# Patient Record
Sex: Female | Born: 1990
Health system: Southern US, Community
[De-identification: ages and names within clinical notes are randomized; demographics above are authoritative.]

---

## 2017-05-17 ENCOUNTER — Ambulatory Visit (INDEPENDENT_AMBULATORY_CARE_PROVIDER_SITE_OTHER): Payer: 59 | Admitting: Family Medicine

## 2017-05-17 ENCOUNTER — Encounter: Payer: Self-pay | Admitting: Family Medicine

## 2017-05-17 VITALS — BP 126/82 | HR 78 | Temp 98.3°F | Resp 16 | Wt 164.2 lb

## 2017-05-17 DIAGNOSIS — H1031 Unspecified acute conjunctivitis, right eye: Secondary | ICD-10-CM

## 2017-05-17 DIAGNOSIS — H01001 Unspecified blepharitis right upper eyelid: Secondary | ICD-10-CM

## 2017-05-17 MED ORDER — ERYTHROMYCIN 5 MG/GM OP OINT: 1.0000 "application " | TOPICAL_OINTMENT | Freq: Three times a day (TID) | OPHTHALMIC | 0 refills | Status: DC

## 2017-05-17 NOTE — Progress Notes (Signed)
Subjective:  This chart was scribed for Shade Flood, MD by Veverly Fells, at Primary Care at [facility].  This patient was seen in room 2 and the patient's care was started at 11:20 AM.   Chief Complaint  Patient presents with  . Eye Pain    right eye x1week      Patient ID: Christy Woodward, female    DOB: May 21, 1991, 26 y.o.   MRN: [id number]  HPI HPI Comments: Christy Woodward is a 26 y.o. female who presents to Primary Care at [facility] with right eye pain onset about one week ago with associated symptoms of a small pimple like bump and redness.  She has had associated symptoms of crusting for the past week. Patient states that her eye has been getting dry more frequently since her symptoms started so she has been using eye drops daily.  She had to use a compress when this occurred because it was so dry. Patient has been picking at the pimple like bump in her eye as it was bothering her.  She is currently getting over a cold- started less than a week ago (sore throat- now resolved, headache and congestion).  She denies a fever. She took Dayquil and Nyquil for relief during her cold like symptoms.  She does not wear contacts or glasses.   Patient is a Designer, jewellery.    There are no active problems to display for this patient.  No past medical history on file. No past surgical history on file. No Known Allergies Prior to Admission medications   Not on File   Social History   Social History  . Marital status: Single    Spouse name: N/A  . Number of children: N/A  . Years of education: N/A   Occupational History  . Not on file.   Social History Main Topics  . Smoking status: Never Smoker  . Smokeless tobacco: Never Used  . Alcohol use No  . Drug use: Unknown  . Sexual activity: Not on file   Other Topics Concern  . Not on file   Social History Narrative  . No narrative on file      Review of Systems  Constitutional: Negative for chills and fever.  Eyes:  Positive for pain, discharge and redness.  Respiratory: Negative for cough, choking and shortness of breath.   Gastrointestinal: Negative for nausea and vomiting.  Musculoskeletal: Negative for neck pain and neck stiffness.  Neurological: Negative for seizures and speech difficulty.       Objective:   Physical Exam  Constitutional: She is oriented to person, place, and time. She appears well-developed and well-nourished. No distress.  HENT:  Head: Normocephalic and atraumatic.  Right Ear: Hearing, tympanic membrane, external ear and ear canal normal.  Left Ear: Hearing, tympanic membrane, external ear and ear canal normal.  Nose: Nose normal.  Mouth/Throat: Oropharynx is clear and moist. No oropharyngeal exudate.  Eyes: Conjunctivae and EOM are normal. Pupils are equal, round, and reactive to light.  Some injection throughout the right sclera diffusely with some crusting on the right upper lid- lateral aspect. very minimal soft tissue swelling of the lateral lid in the area of exudate. Anterior chamber is clear. No pre auricular lymph nodes.  No exudate at the canthi.   Cardiovascular: Normal rate, regular rhythm, normal heart sounds and intact distal pulses.   No murmur heard. Pulmonary/Chest: Effort normal and breath sounds normal. No respiratory distress. She has no wheezes. She has no  rhonchi.  Neurological: She is alert and oriented to person, place, and time.  Skin: Skin is warm and dry. No rash noted.  Psychiatric: She has a normal mood and affect. Her behavior is normal.  Vitals reviewed.   Vitals:   05/17/17 1041  BP: 126/82  Pulse: 78  Resp: 16  Temp: 98.3 F (36.8 C)  TempSrc: Oral  SpO2: 100%  Weight: 164 lb 3.2 oz (74.5 kg)    Visual Acuity Screening   Right eye Left eye Both eyes  Without correction: 20/15 20/15 20/13-1  With correction:            Assessment & Plan:    Christy DyRaven I Woodward is a 26 y.o. female Blepharitis of right upper eyelid, unspecified  type - Plan: erythromycin (ROMYCIN) ophthalmic ointment  Acute conjunctivitis of right eye, unspecified acute conjunctivitis type - Plan: erythromycin (ROMYCIN) ophthalmic ointment  Small area of inflammation/crusting right upper eyelid, now with secondary conjunctivitis. Other viral symptoms recently that are improving, possible viral conjunctivitis but with the affected area of eyelid will cover with antibiotic ointment.  - Start erythromycin ointment 3 times a day, daily at bedtime, RTC precautions if not improving for the next few days. Handout given on blepharitis treatment as well.  Meds ordered this encounter  Medications  . erythromycin [facility](ROMYCIN) ophthalmic ointment    Sig: Place 1 application into the right eye 3 (three) times daily. And bedtime. 1/2 inch ribbon each time.    Dispense:  3.5 g    Refill:  0   Patient Instructions    You may have a mild course of blepharitis on the right upper eyelid, but conjunctiva does appear to have some component of infection at this time. See information below on both conditions. Start erythromycin ointment 3 times per day, and at bedtime. If redness is not improving within the next few days, worsening vision, fevers, or other worsening symptoms, recommend recheck here or with eye care provider.   Bacterial Conjunctivitis Bacterial conjunctivitis is an infection of the clear membrane that covers the white part of your eye and the inner surface of your eyelid (conjunctiva). When the blood vessels in your conjunctiva become inflamed, your eye becomes red or pink, and it will probably feel itchy. Bacterial conjunctivitis spreads very easily from person to person (is contagious). It also spreads easily from one eye to the other eye. What are the causes? This condition is caused by several common bacteria. You may get the infection if you come into close contact with another person who is infected. You may also come into contact with items that are  contaminated with the bacteria, such as a face towel, contact lens solution, or eye makeup. What increases the risk? This condition is more likely to develop in people who:  Are exposed to other people who have the infection.  Wear contact lenses.  Have a sinus infection.  Have had a recent eye injury or surgery.  Have a weak body defense system (immune system).  Have a medical condition that causes dry eyes. What are the signs or symptoms? Symptoms of this condition include:  Eye redness.  Tearing or watery eyes.  Itchy eyes.  Burning feeling in your eyes.  Thick, yellowish discharge from an eye. This may turn into a crust on the eyelid overnight and cause your eyelids to stick together.  Swollen eyelids.  Blurred vision. How is this diagnosed? Your health care provider can diagnose this condition based on your symptoms and  medical history. Your health care provider may also take a sample of discharge from your eye to find the cause of your infection. This is rarely done. How is this treated? Treatment for this condition includes:  Antibiotic eye drops or ointment to clear the infection more quickly and prevent the spread of infection to others.  Oral antibiotic medicines to treat infections that do not respond to drops or ointments, or last longer than 10 days.  Cool, wet cloths (cool compresses) placed on the eyes.  Artificial tears applied 2-6 times a day. Follow these instructions at home: Medicines   Take or apply your antibiotic medicine as told by your health care provider. Do not stop taking or applying the antibiotic even if you start to feel better.  Take or apply over-the-counter and prescription medicines only as told by your health care provider.  Be very careful to avoid touching the edge of your eyelid with the eye drop bottle or the ointment tube when you apply medicines to the affected eye. This will keep you from spreading the infection to your  other eye or to other people. Managing discomfort   Gently wipe away any drainage from your eye with a warm, wet washcloth or a cotton ball.  Apply a cool, clean washcloth to your eye for 10-20 minutes, 3-4 times a day. General instructions   Do not wear contact lenses until the inflammation is gone and your health care provider says it is safe to wear them again. Ask your health care provider how to sterilize or replace your contact lenses before you use them again. Wear glasses until you can resume wearing contacts.  Avoid wearing eye makeup until the inflammation is gone. Throw away any old eye cosmetics that may be contaminated.  Change or wash your pillowcase every day.  Do not share towels or washcloths. This may spread the infection.  Wash your hands often with soap and water. Use paper towels to dry your hands.  Avoid touching or rubbing your eyes.  Do not drive or use heavy machinery if your vision is blurred. Contact a health care provider if:  You have a fever.  Your symptoms do not get better after 10 days. Get help right away if:  You have a fever and your symptoms suddenly get worse.  You have severe pain when you move your eye.  You have facial pain, redness, or swelling.  You have sudden loss of vision. This information is not intended to replace advice given to you by your health care provider. Make sure you discuss any questions you have with your health care provider. Document Released: 12/05/2005 Document Revised: 04/14/2016 Document Reviewed: 09/17/2015 Elsevier Interactive Patient Education  2017 Elsevier Inc.    Blepharitis Blepharitis is inflammation of the eyelids. Blepharitis may happen with:  Reddish, scaly skin around the scalp and eyebrows.  Burning or itching of the eyelids.  Eye discharge at night that causes the eyelashes to stick together in the morning.  Eyelashes that fall out.  Sensitivity to light. Follow these instructions at  home: Pay attention to any changes in how you look or feel. Follow these instructions to help with your condition: Keeping Clean   Wash your hands often.  Wash your eyelids with warm water or with warm water that is mixed with a small amount of baby shampoo. Do this two times per day or as often as needed.  Wash your face and eyebrows at least once a day.  Use a  clean towel each time you dry your eyelids. Do not use this towel to clean or dry other areas of your body. Do not share your towel with anyone. General instructions   Avoid wearing makeup until you get better. Do not share makeup with anyone.  Avoid rubbing your eyes.  Apply warm compresses to your eyes 2 times per day for 10 minutes at a time, or as told by your health care provider.  If you were prescribed an antibiotic ointment or steroid drops, apply or use the medicine as told by your health care provider. Do not stop using the medicine even if you feel better.  Keep all follow-up visits as told by your health care provider. This is important. Contact a health care provider if:  Your eyelids feel hot.  You have blisters or a rash on your eyelids.  The condition does not go away in 2-4 days.  The inflammation gets worse. Get help right away if:  You have pain or redness that gets worse or spreads to other parts of your face.  Your vision changes.  You have pain when looking at lights or moving objects.  You have a fever. This information is not intended to replace advice given to you by your health care provider. Make sure you discuss any questions you have with your health care provider. Document Released: 12/02/2000 Document Revised: 05/12/2016 Document Reviewed: 03/30/2015 Elsevier Interactive Patient Education  2017 [employer].    IF you received an x-ray today, you will receive an invoice from [facility]. Please contact [facility] at [phone] with questions or concerns  regarding your invoice.   IF you received labwork today, you will receive an invoice from [place]. Please contact [facility] at [phone] with questions or concerns regarding your invoice.   Our billing staff will not be able to assist you with questions regarding bills from these companies.  You will be contacted with the lab results as soon as they are available. The fastest way to get your results is to activate your My Chart account. Instructions are located on the last page of this paperwork. If you have not heard from [place] regarding the results in 2 weeks, please contact this office.       I personally performed the services described in this documentation, which was scribed in my presence. The recorded information has been reviewed and considered for accuracy and completeness, addended by me as needed, and agree with information above.  Signed,   Meredith Staggers, MD Primary Care at [facility].  05/17/17 11:36 AM

## 2017-05-17 NOTE — Patient Instructions (Addendum)
You may have a mild course of blepharitis on the right upper eyelid, but conjunctiva does appear to have some component of infection at this time. See information below on both conditions. Start erythromycin ointment 3 times per day, and at bedtime. If redness is not improving within the next few days, worsening vision, fevers, or other worsening symptoms, recommend recheck here or with eye care provider.   Bacterial Conjunctivitis Bacterial conjunctivitis is an infection of the clear membrane that covers the white part of your eye and the inner surface of your eyelid (conjunctiva). When the blood vessels in your conjunctiva become inflamed, your eye becomes red or pink, and it will probably feel itchy. Bacterial conjunctivitis spreads very easily from person to person (is contagious). It also spreads easily from one eye to the other eye. What are the causes? This condition is caused by several common bacteria. You may get the infection if you come into close contact with another person who is infected. You may also come into contact with items that are contaminated with the bacteria, such as a face towel, contact lens solution, or eye makeup. What increases the risk? This condition is more likely to develop in people who:  Are exposed to other people who have the infection.  Wear contact lenses.  Have a sinus infection.  Have had a recent eye injury or surgery.  Have a weak body defense system (immune system).  Have a medical condition that causes dry eyes. What are the signs or symptoms? Symptoms of this condition include:  Eye redness.  Tearing or watery eyes.  Itchy eyes.  Burning feeling in your eyes.  Thick, yellowish discharge from an eye. This may turn into a crust on the eyelid overnight and cause your eyelids to stick together.  Swollen eyelids.  Blurred vision. How is this diagnosed? Your health care provider can diagnose this condition based on your symptoms and  medical history. Your health care provider may also take a sample of discharge from your eye to find the cause of your infection. This is rarely done. How is this treated? Treatment for this condition includes:  Antibiotic eye drops or ointment to clear the infection more quickly and prevent the spread of infection to others.  Oral antibiotic medicines to treat infections that do not respond to drops or ointments, or last longer than 10 days.  Cool, wet cloths (cool compresses) placed on the eyes.  Artificial tears applied 2-6 times a day. Follow these instructions at home: Medicines   Take or apply your antibiotic medicine as told by your health care provider. Do not stop taking or applying the antibiotic even if you start to feel better.  Take or apply over-the-counter and prescription medicines only as told by your health care provider.  Be very careful to avoid touching the edge of your eyelid with the eye drop bottle or the ointment tube when you apply medicines to the affected eye. This will keep you from spreading the infection to your other eye or to other people. Managing discomfort   Gently wipe away any drainage from your eye with a warm, wet washcloth or a cotton ball.  Apply a cool, clean washcloth to your eye for 10-20 minutes, 3-4 times a day. General instructions   Do not wear contact lenses until the inflammation is gone and your health care provider says it is safe to wear them again. Ask your health care provider how to sterilize or replace your contact lenses before you  use them again. Wear glasses until you can resume wearing contacts.  Avoid wearing eye makeup until the inflammation is gone. Throw away any old eye cosmetics that may be contaminated.  Change or wash your pillowcase every day.  Do not share towels or washcloths. This may spread the infection.  Wash your hands often with soap and water. Use paper towels to dry your hands.  Avoid touching or  rubbing your eyes.  Do not drive or use heavy machinery if your vision is blurred. Contact a health care provider if:  You have a fever.  Your symptoms do not get better after 10 days. Get help right away if:  You have a fever and your symptoms suddenly get worse.  You have severe pain when you move your eye.  You have facial pain, redness, or swelling.  You have sudden loss of vision. This information is not intended to replace advice given to you by your health care provider. Make sure you discuss any questions you have with your health care provider. Document Released: 12/05/2005 Document Revised: 04/14/2016 Document Reviewed: 09/17/2015 Elsevier Interactive Patient Education  2017 Elsevier Inc.    Blepharitis Blepharitis is inflammation of the eyelids. Blepharitis may happen with:  Reddish, scaly skin around the scalp and eyebrows.  Burning or itching of the eyelids.  Eye discharge at night that causes the eyelashes to stick together in the morning.  Eyelashes that fall out.  Sensitivity to light. Follow these instructions at home: Pay attention to any changes in how you look or feel. Follow these instructions to help with your condition: Keeping Clean   Wash your hands often.  Wash your eyelids with warm water or with warm water that is mixed with a small amount of baby shampoo. Do this two times per day or as often as needed.  Wash your face and eyebrows at least once a day.  Use a clean towel each time you dry your eyelids. Do not use this towel to clean or dry other areas of your body. Do not share your towel with anyone. General instructions   Avoid wearing makeup until you get better. Do not share makeup with anyone.  Avoid rubbing your eyes.  Apply warm compresses to your eyes 2 times per day for 10 minutes at a time, or as told by your health care provider.  If you were prescribed an antibiotic ointment or steroid drops, apply or use the medicine as  told by your health care provider. Do not stop using the medicine even if you feel better.  Keep all follow-up visits as told by your health care provider. This is important. Contact a health care provider if:  Your eyelids feel hot.  You have blisters or a rash on your eyelids.  The condition does not go away in 2-4 days.  The inflammation gets worse. Get help right away if:  You have pain or redness that gets worse or spreads to other parts of your face.  Your vision changes.  You have pain when looking at lights or moving objects.  You have a fever. This information is not intended to replace advice given to you by your health care provider. Make sure you discuss any questions you have with your health care provider. Document Released: 12/02/2000 Document Revised: 05/12/2016 Document Reviewed: 03/30/2015 Elsevier Interactive Patient Education  2017 [employer]    IF you received an x-ray today, you will receive an invoice from [facility]. Please contact [facility] at  [phone] with questions or concerns regarding your invoice.   IF you received labwork today, you will receive an invoice from [place]. Please contact [facility] at [phone] with questions or concerns regarding your invoice.   Our billing staff will not be able to assist you with questions regarding bills from these companies.  You will be contacted with the lab results as soon as they are available. The fastest way to get your results is to activate your My Chart account. Instructions are located on the last page of this paperwork. If you have not heard from [place] regarding the results in 2 weeks, please contact this office.

## 2017-08-08 DIAGNOSIS — T7840XA Allergy, unspecified, initial encounter: Secondary | ICD-10-CM | POA: Diagnosis not present

## 2017-08-15 DIAGNOSIS — L508 Other urticaria: Secondary | ICD-10-CM | POA: Diagnosis not present

## 2017-08-15 DIAGNOSIS — R21 Rash and other nonspecific skin eruption: Secondary | ICD-10-CM | POA: Diagnosis not present

## 2017-09-06 DIAGNOSIS — Z832 Family history of diseases of the blood and blood-forming organs and certain disorders involving the immune mechanism: Secondary | ICD-10-CM | POA: Diagnosis not present

## 2017-09-06 DIAGNOSIS — Z8249 Family history of ischemic heart disease and other diseases of the circulatory system: Secondary | ICD-10-CM | POA: Diagnosis not present

## 2017-09-06 DIAGNOSIS — Z124 Encounter for screening for malignant neoplasm of cervix: Secondary | ICD-10-CM | POA: Diagnosis not present

## 2017-09-06 DIAGNOSIS — Z8349 Family history of other endocrine, nutritional and metabolic diseases: Secondary | ICD-10-CM | POA: Diagnosis not present

## 2017-09-06 DIAGNOSIS — R21 Rash and other nonspecific skin eruption: Secondary | ICD-10-CM | POA: Diagnosis not present

## 2017-09-06 DIAGNOSIS — E559 Vitamin D deficiency, unspecified: Secondary | ICD-10-CM | POA: Diagnosis not present

## 2017-10-05 DIAGNOSIS — N946 Dysmenorrhea, unspecified: Secondary | ICD-10-CM | POA: Diagnosis not present

## 2017-10-05 DIAGNOSIS — Z01411 Encounter for gynecological examination (general) (routine) with abnormal findings: Secondary | ICD-10-CM | POA: Diagnosis not present

## 2017-12-28 ENCOUNTER — Ambulatory Visit: Payer: Self-pay | Admitting: Nurse Practitioner

## 2017-12-28 ENCOUNTER — Encounter: Payer: Self-pay | Admitting: Nurse Practitioner

## 2017-12-28 VITALS — BP 118/84 | HR 140 | Temp 100.8°F | Resp 20 | Wt 187.2 lb

## 2017-12-28 DIAGNOSIS — R6889 Other general symptoms and signs: Secondary | ICD-10-CM

## 2017-12-28 DIAGNOSIS — R509 Fever, unspecified: Secondary | ICD-10-CM

## 2017-12-28 LAB — POCT INFLUENZA A/B
INFLUENZA A, POC: NEGATIVE
Influenza B, POC: NEGATIVE

## 2017-12-28 LAB — POCT RAPID STREP A (OFFICE): RAPID STREP A SCREEN: NEGATIVE

## 2017-12-28 MED ORDER — PROMETHAZINE HCL 25 MG PO TABS: 25.0000 mg | ORAL_TABLET | Freq: Three times a day (TID) | ORAL | 0 refills | Status: DC | PRN

## 2017-12-28 MED ORDER — OSELTAMIVIR PHOSPHATE 75 MG PO CAPS: 75.0000 mg | ORAL_CAPSULE | Freq: Two times a day (BID) | ORAL | 0 refills | Status: AC

## 2017-12-28 NOTE — Patient Instructions (Addendum)
Fever, Adult A fever is an increase in the body's temperature. It is usually defined as a temperature of 100F (38C) or higher. Brief mild or moderate fevers generally have no long-term effects, and they often do not require treatment. Moderate or high fevers may make you feel uncomfortable and can sometimes be a sign of a serious illness or disease. The sweating that may occur with repeated or prolonged fever may also cause dehydration. Fever is confirmed by taking a temperature with a thermometer. A measured temperature can vary with:  Age.  Time of day.  Location of the thermometer: ? Mouth (oral). ? Rectum (rectal). ? Ear (tympanic). ? Underarm (axillary). ? Forehead (temporal).  Follow these instructions at home: Pay attention to any changes in your symptoms. Take these actions to help with your condition:  Take over-the counter and prescription medicines only as told by your health care provider. Follow the dosing instructions carefully.  If you were prescribed an antibiotic medicine, take it as told by your health care provider. Do not stop taking the antibiotic even if you start to feel better.  Rest as needed.  Drink enough fluid to keep your urine clear or pale yellow. This helps to prevent dehydration.  Sponge yourself or bathe with room-temperature water to help reduce your body temperature as needed. Do not use ice water.  Do not overbundle yourself in blankets or heavy clothes.  Contact a health care provider if:  You vomit.  You cannot eat or drink without vomiting.  You have diarrhea.  You have pain when you urinate.  Your symptoms do not improve with treatment.  You develop new symptoms.  You develop excessive weakness. Get help right away if:  You have shortness of breath or have trouble breathing.  You are dizzy or you faint.  You are disoriented or confused.  You develop signs of dehydration, such as a dry mouth, decreased urination, or  paleness.  You develop severe pain in your abdomen.  You have persistent vomiting or diarrhea.  You develop a skin rash.  Your symptoms suddenly get worse. This information is not intended to replace advice given to you by your health care provider. Make sure you discuss any questions you have with your health care provider. Document Released: 05/31/2001 Document Revised: 05/12/2016 Document Reviewed: 01/29/2015 Elsevier Interactive Patient Education  2018 [employer].  Influenza, Adult Influenza, more commonly known as "the flu," is a viral infection that primarily affects the respiratory tract. The respiratory tract includes organs that help you breathe, such as the lungs, nose, and throat. The flu causes many common cold symptoms, as well as a high fever and body aches. The flu spreads easily from person to person (is contagious). Getting a flu shot (influenza vaccination) every year is the best way to prevent influenza. What are the causes? Influenza is caused by a virus. You can catch the virus by:  Breathing in droplets from an infected person's cough or sneeze.  Touching something that was recently contaminated with the virus and then touching your mouth, nose, or eyes.  What increases the risk? The following factors may make you more likely to get the flu:  Not cleaning your hands frequently with soap and water or alcohol-based hand sanitizer.  Having close contact with many people during cold and flu season.  Touching your mouth, eyes, or nose without washing or sanitizing your hands first.  Not drinking enough fluids or not eating a healthy diet.  Not getting enough sleep  or exercise.  Being under a high amount of stress.  Not getting a yearly (annual) flu shot.  You may be at a higher risk of complications from the flu, such as a severe lung infection (pneumonia), if you:  Are over the age of 25.  Are pregnant.  Have a weakened disease-fighting system  (immune system). You may have a weakened immune system if you: ? Have HIV or AIDS. ? Are undergoing chemotherapy. ? Aretaking medicines that reduce the activity of (suppress) the immune system.  Have a long-term (chronic) illness, such as heart disease, kidney disease, diabetes, or lung disease.  Have a liver disorder.  Are obese.  Have anemia.  What are the signs or symptoms? Symptoms of this condition typically last 4-10 days and may include:  Fever.  Chills.  Headache, body aches, or muscle aches.  Sore throat.  Cough.  Runny or congested nose.  Chest discomfort and cough.  Poor appetite.  Weakness or tiredness (fatigue).  Dizziness.  Nausea or vomiting.  How is this diagnosed? This condition may be diagnosed based on your medical history and a physical exam. Your health care provider may do a nose or throat swab test to confirm the diagnosis. How is this treated? If influenza is detected early, you can be treated with antiviral medicine that can reduce the length of your illness and the severity of your symptoms. This medicine may be given by mouth (orally) or through an IV tube that is inserted in one of your veins. The goal of treatment is to relieve symptoms by taking care of yourself at home. This may include taking over-the-counter medicines, drinking plenty of fluids, and adding humidity to the air in your home. In some cases, influenza goes away on its own. Severe influenza or complications from influenza may be treated in a hospital. Follow these instructions at home:  Take over-the-counter and prescription medicines only as told by your health care provider.  Use a cool mist humidifier to add humidity to the air in your home. This can make breathing easier.  Rest as needed.  Drink enough fluid to keep your urine clear or pale yellow.  Cover your mouth and nose when you cough or sneeze.  Wash your hands with soap and water often, especially after  you cough or sneeze. If soap and water are not available, use hand sanitizer.  Stay home from work or school as told by your health care provider. Unless you are visiting your health care provider, try to avoid leaving home until your fever has been gone for 24 hours without the use of medicine.  Keep all follow-up visits as told by your health care provider. This is important. How is this prevented?  Getting an annual flu shot is the best way to avoid getting the flu. You may get the flu shot in late summer, fall, or winter. Ask your health care provider when you should get your flu shot.  Wash your hands often or use hand sanitizer often.  Avoid contact with people who are sick during cold and flu season.  Eat a healthy diet, drink plenty of fluids, get enough sleep, and exercise regularly. Contact a health care provider if:  You develop new symptoms.  You have: ? Chest pain. ? Diarrhea. ? A fever.  Your cough gets worse.  You produce more mucus.  You feel nauseous or you vomit. Get help right away if:  You develop shortness of breath or difficulty breathing.  Your skin  or nails turn a bluish color.  You have severe pain or stiffness in your neck.  You develop a sudden headache or sudden pain in your face or ear.  You cannot stop vomiting. This information is not intended to replace advice given to you by your health care provider. Make sure you discuss any questions you have with your health care provider. Document Released: 12/02/2000 Document Revised: 05/12/2016 Document Reviewed: 09/29/2015 Elsevier Interactive Patient Education  2017 [employer].

## 2017-12-28 NOTE — Progress Notes (Signed)
Subjective:     Christy Woodward I Lasser is a 27 y.o. female who presents for evaluation of influenza like symptoms since this morning. Symptoms include chills, enlarged tonsils, headache, myalgias, productive cough, nausea/vomiting and fever and have been present since this morning after waking. She has tried to alleviate the symptoms with patient attempted Tylenol but unable to keep down. with no relief. High risk factors for influenza complications: patient's mother has suspected influenza..  The following portions of the patient's history were reviewed and updated as appropriate: allergies, current medications and past medical history.  Review of Systems Constitutional: positive for anorexia, chills, fatigue, fevers, malaise and bodyaches, negative for night sweats and weight loss Eyes: negative Ears, nose, mouth, throat, and face: positive for nasal congestion and sore throat, negative for ear drainage, earaches and hoarseness Respiratory: positive for cough, negative for asthma, stridor and wheezing Cardiovascular: positive for tachycardia Gastrointestinal: positive for nausea and vomiting, negative for abdominal pain, constipation, diarrhea and melena Neurological: positive for headaches     Objective:    BP 118/84 (BP Location: Right Arm, Patient Position: Sitting, Cuff Size: Normal)   Pulse (!) 140   Temp (!) 100.8 F (38.2 C) (Oral)   Resp 20   Wt 187 lb 3.2 oz (84.9 kg)   SpO2 98%  General appearance: alert, cooperative and fatigued Head: Normocephalic, without obvious abnormality, atraumatic Eyes: conjunctivae/corneas clear. PERRL, EOM's intact. Fundi benign. Ears: normal TM's and external ear canals both ears Nose: clear discharge, moderate congestion, no sinus tenderness Throat: lips, mucosa, and tongue normal; teeth and gums normal Lungs: clear to auscultation bilaterally Heart: regular rate and rhythm, S1, S2 normal, no murmur, click, rub or gallop and tachycardic Abdomen: soft,  non-tender; bowel sounds normal; no masses,  no organomegaly Pulses: 2+ and symmetric Skin: Skin color, texture, turgor normal. No rashes or lesions Lymph nodes: cervical adenopathy bilaterally, submandibular nodes normal Neurologic: Grossly normal    Assessment:    Fever and Influenza like syndrome    Plan:    Supportive care with appropriate antipyretics and fluids. Educational material distributed and questions answered. Tamiflu and Phenergan as prescribed.  Patient encourged to use symptomatic treatment, i.e. fluids, anti-metics, BRAT diet, etc.  Informed patient to follow up in ER if worsening fever, inability to eat/drink, abd pain or other concerns.   Emphasized to patient the need to drink to prevent hyrdration. Work note provided, patient to return to work after 48 hours fever-free.  Patient verbalized understanding.

## 2019-01-07 ENCOUNTER — Ambulatory Visit
Admission: EM | Admit: 2019-01-07 | Discharge: 2019-01-07 | Disposition: A | Discharge status: Home / Self Care | Payer: 59 | Attending: Physician Assistant | Admitting: Physician Assistant

## 2019-01-07 ENCOUNTER — Ambulatory Visit: Payer: 59

## 2019-01-07 DIAGNOSIS — S62312A Displaced fracture of base of third metacarpal bone, right hand, initial encounter for closed fracture: Secondary | ICD-10-CM | POA: Insufficient documentation

## 2019-01-07 MED ORDER — HYDROCODONE-ACETAMINOPHEN 5-325 MG PO TABS: 1.0000 | ORAL_TABLET | ORAL | 0 refills | Status: AC | PRN

## 2019-01-07 NOTE — ED Triage Notes (Signed)
Pt states while in yoga, doing a move and landed on rt hand. Swelling noted, ice pack in place

## 2019-01-07 NOTE — Discharge Instructions (Addendum)
Schedule to see the Hand Surgeon for evaluation

## 2019-01-07 NOTE — ED Provider Notes (Signed)
[facility]    CSN: [id number] Arrival date & time: 01/07/19  1623     History   Chief Complaint Chief Complaint  Patient presents with  . Hand Pain    HPI Christy Woodward is a 28 y.o. female.   The history is provided by the patient. No language interpreter was used.  Hand Pain  This is a new problem. The current episode started 1 to 2 hours ago. The problem occurs constantly. The problem has been gradually worsening. Nothing aggravates the symptoms. Nothing relieves the symptoms. She has tried nothing for the symptoms. The treatment provided no relief.  Pt complains of pain in right hand after falling in yoga.   History reviewed. No pertinent past medical history.  There are no active problems to display for this patient.   History reviewed. No pertinent surgical history.  OB History   No obstetric history on file.      Home Medications    Prior to Admission medications   Medication Sig Start Date End Date Taking? Authorizing Provider  erythromycin [facility]) ophthalmic ointment Place 1 application into the right eye 3 (three) times daily. And bedtime. 1/2 inch ribbon each time. Patient not taking: Reported on 12/28/2017 05/17/17   Shade Flood, MD  HYDROcodone-acetaminophen (NORCO/VICODIN) 5-325 MG tablet Take 1 tablet by mouth every 4 (four) hours as needed for moderate pain. 01/07/19 01/07/20  Elson Areas, PA-C  promethazine (PHENERGAN) 25 MG tablet Take 1 tablet (25 mg total) by mouth every 8 (eight) hours as needed for up to 3 days for nausea or vomiting. 12/28/17 12/31/17  Benay Pike, NP    Family History Family History  Problem Relation Age of Onset  . Hypertension Mother   . Hyperlipidemia Mother   . Hyperlipidemia Maternal Grandmother   . Hypertension Maternal Grandmother     Social History Social History   Tobacco Use  . Smoking status: Never Smoker  . Smokeless tobacco: Never Used  Substance Use Topics  . Alcohol  use: No  . Drug use: Not on file     Allergies   Patient has no known allergies.   Review of Systems Review of Systems  Musculoskeletal: Positive for joint swelling and myalgias.  All other systems reviewed and are negative.    Physical Exam Triage Vital Signs ED Triage Vitals  Enc Vitals Group     BP 01/07/19 1655 122/70     Pulse Rate 01/07/19 1655 95     Resp 01/07/19 1655 18     Temp 01/07/19 1655 99 F (37.2 C)     Temp Source 01/07/19 1655 Oral     SpO2 01/07/19 1655 99 %     Weight --      Height --      Head Circumference --      Peak Flow --      Pain Score 01/07/19 1656 6     Pain Loc --      Pain Edu? --      Excl. in GC? --    No data found.  Updated Vital Signs BP 122/70 (BP Location: Left Arm)   Pulse 95   Temp 99 F (37.2 C) (Oral)   Resp 18   LMP 12/18/2018   SpO2 99%   Visual Acuity Right Eye Distance:   Left Eye Distance:   Bilateral Distance:    Right Eye Near:   Left Eye Near:    Bilateral Near:  Physical Exam Vitals signs reviewed.  Constitutional:      Appearance: Normal appearance.  HENT:     Head: Normocephalic.  Musculoskeletal:        General: Swelling and tenderness present.     Comments: Swollen tender right hand,  From,  Pain with movement.   Skin:    General: Skin is warm.  Neurological:     General: No focal deficit present.     Mental Status: She is alert.  Psychiatric:        Mood and Affect: Mood normal.      UC Treatments / Results  Labs (all labs ordered are listed, but only abnormal results are displayed) Labs Reviewed - No data to display  EKG None  Radiology Dg Hand Complete Right  Result Date: 01/07/2019 CLINICAL DATA:  28 year old female with right hand pain after injuring her hand in a yoga class EXAM: RIGHT HAND - COMPLETE 3+ VIEW COMPARISON:  None. FINDINGS: Acute obliquely oriented and mildly comminuted fracture through the mid aspect of the third metacarpal. There is associated soft  tissue swelling about the hand. The remaining visualized bones and joints appear intact and unremarkable. IMPRESSION: Acute mildly comminuted obliquely oriented fracture through the mid aspect of the third metacarpal. Electronically Signed   By: Malachy MoanHeath  McCullough M.D.   On: 01/07/2019 17:32    Procedures Procedures (including critical care time)  Medications Ordered in UC Medications - No data to display  Initial Impression / Assessment and Plan / UC Course  I have reviewed the triage vital signs and the nursing notes.  Pertinent labs & imaging results that were available during my care of the patient were reviewed by me and considered in my medical decision making (see chart for details).     MDM  Pt counseled on xrays and fracture. Pt advised to follow up with Dr. Melvyn Novasrtmann for evaluation  Final Clinical Impressions(s) / UC Diagnoses   Final diagnoses:  Closed displaced fracture of base of third metacarpal bone of right hand, initial encounter     Discharge Instructions     Schedule to see the Hand Surgeon for evaluation     ED Prescriptions    Medication Sig Dispense Auth. Provider   HYDROcodone-acetaminophen (NORCO/VICODIN) 5-325 MG tablet Take 1 tablet by mouth every 4 (four) hours as needed for moderate pain. 10 tablet Elson AreasSofia, Leslie K, [place]-C     Controlled Substance Prescriptions [place] Controlled Substance Registry consulted? Not Applicable  An After Visit Summary was printed and given to the patient.    Elson AreasSofia, Leslie K, [place]-C 01/07/19 1941

## 2019-01-09 DIAGNOSIS — S6291XA Unspecified fracture of right wrist and hand, initial encounter for closed fracture: Secondary | ICD-10-CM | POA: Diagnosis not present

## 2019-01-11 DIAGNOSIS — S62322A Displaced fracture of shaft of third metacarpal bone, right hand, initial encounter for closed fracture: Secondary | ICD-10-CM | POA: Diagnosis not present

## 2019-01-11 NOTE — Progress Notes (Signed)
Pt stated that she was at a conference and requested that pre-op instructions be left on her mobile phone- voice mailbox. Pt made aware to stop taking vitamins, fish oil and herbal medications. Do not take any NSAIDs ie: Ibuprofen, Advil, Naproxen (Aleve), Motrin, BC and Goody Powder.

## 2019-01-14 ENCOUNTER — Ambulatory Visit (HOSPITAL_COMMUNITY): Payer: 59 | Admitting: Anesthesiology

## 2019-01-14 ENCOUNTER — Encounter (HOSPITAL_COMMUNITY): Payer: Self-pay

## 2019-01-14 ENCOUNTER — Encounter (HOSPITAL_COMMUNITY)
Admission: RE | Disposition: A | Discharge status: Home / Self Care | Payer: Self-pay | Source: Home / Self Care | Attending: Orthopedic Surgery

## 2019-01-14 ENCOUNTER — Other Ambulatory Visit: Payer: Self-pay

## 2019-01-14 ENCOUNTER — Ambulatory Visit (HOSPITAL_COMMUNITY)
Admission: RE | Admit: 2019-01-14 | Discharge: 2019-01-14 | Disposition: A | Discharge status: Home / Self Care | Payer: 59 | Attending: Orthopedic Surgery | Admitting: Orthopedic Surgery

## 2019-01-14 DIAGNOSIS — X58XXXA Exposure to other specified factors, initial encounter: Secondary | ICD-10-CM | POA: Insufficient documentation

## 2019-01-14 DIAGNOSIS — Y9342 Activity, yoga: Secondary | ICD-10-CM | POA: Insufficient documentation

## 2019-01-14 DIAGNOSIS — S62302A Unspecified fracture of third metacarpal bone, right hand, initial encounter for closed fracture: Secondary | ICD-10-CM | POA: Diagnosis not present

## 2019-01-14 DIAGNOSIS — S62322D Displaced fracture of shaft of third metacarpal bone, right hand, subsequent encounter for fracture with routine healing: Secondary | ICD-10-CM | POA: Diagnosis not present

## 2019-01-14 DIAGNOSIS — S62322A Displaced fracture of shaft of third metacarpal bone, right hand, initial encounter for closed fracture: Secondary | ICD-10-CM | POA: Diagnosis not present

## 2019-01-14 HISTORY — PX: OPEN REDUCTION INTERNAL FIXATION (ORIF) METACARPAL: SHX6234

## 2019-01-14 LAB — CBC
HCT: 37.1 % (ref 36.0–46.0)
Hemoglobin: 11.9 g/dL — ABNORMAL LOW (ref 12.0–15.0)
MCH: 28.3 pg (ref 26.0–34.0)
MCHC: 32.1 g/dL (ref 30.0–36.0)
MCV: 88.1 fL (ref 80.0–100.0)
Platelets: 305 10*3/uL (ref 150–400)
RBC: 4.21 MIL/uL (ref 3.87–5.11)
RDW: 14.8 % (ref 11.5–15.5)
WBC: 9.1 10*3/uL (ref 4.0–10.5)
nRBC: 0 % (ref 0.0–0.2)

## 2019-01-14 LAB — POCT PREGNANCY, URINE: Preg Test, Ur: NEGATIVE

## 2019-01-14 SURGERY — OPEN REDUCTION INTERNAL FIXATION (ORIF) METACARPAL
Anesthesia: General | Site: Hand | Laterality: Right

## 2019-01-14 MED ORDER — FENTANYL CITRATE (PF) 100 MCG/2ML IJ SOLN
25.0000 ug | INTRAMUSCULAR | Status: DC | PRN
  Administered 2019-01-14 (×2): 50 ug via INTRAVENOUS

## 2019-01-14 MED ORDER — BUPIVACAINE HCL (PF) 0.25 % IJ SOLN
INTRAMUSCULAR | Status: AC
  Filled 2019-01-14: qty 30

## 2019-01-14 MED ORDER — PROPOFOL 10 MG/ML IV BOLUS
INTRAVENOUS | Status: AC
  Filled 2019-01-14: qty 20

## 2019-01-14 MED ORDER — BUPIVACAINE HCL (PF) 0.25 % IJ SOLN
INTRAMUSCULAR | Status: DC | PRN
  Administered 2019-01-14: 9 mL

## 2019-01-14 MED ORDER — MIDAZOLAM HCL 2 MG/2ML IJ SOLN
INTRAMUSCULAR | Status: AC
  Filled 2019-01-14: qty 2

## 2019-01-14 MED ORDER — FENTANYL CITRATE (PF) 250 MCG/5ML IJ SOLN
INTRAMUSCULAR | Status: AC
  Filled 2019-01-14: qty 5

## 2019-01-14 MED ORDER — LACTATED RINGERS IV SOLN
INTRAVENOUS | Status: DC | PRN
  Administered 2019-01-14: 16:00:00 via INTRAVENOUS

## 2019-01-14 MED ORDER — PROMETHAZINE HCL 25 MG/ML IJ SOLN: 6.2500 mg | INTRAMUSCULAR | Status: DC | PRN

## 2019-01-14 MED ORDER — LIDOCAINE HCL (CARDIAC) PF 100 MG/5ML IV SOSY
PREFILLED_SYRINGE | INTRAVENOUS | Status: DC | PRN
  Administered 2019-01-14: 80 mg via INTRAVENOUS

## 2019-01-14 MED ORDER — DEXAMETHASONE SODIUM PHOSPHATE 10 MG/ML IJ SOLN
INTRAMUSCULAR | Status: DC | PRN
  Administered 2019-01-14: 10 mg via INTRAVENOUS

## 2019-01-14 MED ORDER — CEFAZOLIN SODIUM-DEXTROSE 2-4 GM/100ML-% IV SOLN
2.0000 g | INTRAVENOUS | Status: AC
  Administered 2019-01-14: 2 g via INTRAVENOUS

## 2019-01-14 MED ORDER — OXYCODONE HCL 5 MG/5ML PO SOLN: 5.0000 mg | Freq: Once | ORAL | Status: DC | PRN

## 2019-01-14 MED ORDER — PROPOFOL 10 MG/ML IV BOLUS
INTRAVENOUS | Status: DC | PRN
  Administered 2019-01-14: 200 mg via INTRAVENOUS

## 2019-01-14 MED ORDER — 0.9 % SODIUM CHLORIDE (POUR BTL) OPTIME
TOPICAL | Status: DC | PRN
  Administered 2019-01-14: 1000 mL

## 2019-01-14 MED ORDER — FENTANYL CITRATE (PF) 250 MCG/5ML IJ SOLN
INTRAMUSCULAR | Status: DC | PRN
  Administered 2019-01-14: 50 ug via INTRAVENOUS
  Administered 2019-01-14: 25 ug via INTRAVENOUS
  Administered 2019-01-14: 100 ug via INTRAVENOUS

## 2019-01-14 MED ORDER — ONDANSETRON HCL 4 MG/2ML IJ SOLN
INTRAMUSCULAR | Status: DC | PRN
  Administered 2019-01-14: 4 mg via INTRAVENOUS

## 2019-01-14 MED ORDER — EPHEDRINE SULFATE 50 MG/ML IJ SOLN
INTRAMUSCULAR | Status: DC | PRN
  Administered 2019-01-14 (×2): 5 mg via INTRAVENOUS

## 2019-01-14 MED ORDER — CHLORHEXIDINE GLUCONATE 4 % EX LIQD: 60.0000 mL | Freq: Once | CUTANEOUS | Status: DC

## 2019-01-14 MED ORDER — OXYCODONE HCL 5 MG PO TABS: 5.0000 mg | ORAL_TABLET | Freq: Once | ORAL | Status: DC | PRN

## 2019-01-14 MED ORDER — MIDAZOLAM HCL 5 MG/5ML IJ SOLN
INTRAMUSCULAR | Status: DC | PRN
  Administered 2019-01-14: 2 mg via INTRAVENOUS

## 2019-01-14 MED ORDER — FENTANYL CITRATE (PF) 100 MCG/2ML IJ SOLN
INTRAMUSCULAR | Status: AC
  Administered 2019-01-14: 50 ug via INTRAVENOUS
  Filled 2019-01-14: qty 2

## 2019-01-14 SURGICAL SUPPLY — 48 items
BANDAGE ACE 3X5.8 VEL STRL LF (GAUZE/BANDAGES/DRESSINGS) ×2 IMPLANT
BANDAGE ACE 4X5 VEL STRL LF (GAUZE/BANDAGES/DRESSINGS) ×2 IMPLANT
BIT DRILL 1.1 (BIT) ×1
BIT DRILL 1.5 MINI-QUICK CON (BIT) ×2 IMPLANT
BIT DRILL 60X20X1.1XQC TMX (BIT) ×1 IMPLANT
BIT DRL 60X20X1.1XQC TMX (BIT) ×1
BNDG ESMARK 4X9 LF (GAUZE/BANDAGES/DRESSINGS) ×2 IMPLANT
BNDG GAUZE ELAST 4 BULKY (GAUZE/BANDAGES/DRESSINGS) ×2 IMPLANT
CORDS BIPOLAR (ELECTRODE) ×2 IMPLANT
COVER SURGICAL LIGHT HANDLE (MISCELLANEOUS) ×2 IMPLANT
COVER WAND RF STERILE (DRAPES) ×2 IMPLANT
CUFF TOURNIQUET SINGLE 18IN (TOURNIQUET CUFF) ×2 IMPLANT
DRAPE OEC MINIVIEW 54X84 (DRAPES) ×2 IMPLANT
DRAPE SURG 17X11 SM STRL (DRAPES) ×2 IMPLANT
DRSG ADAPTIC 3X8 NADH LF (GAUZE/BANDAGES/DRESSINGS) ×2 IMPLANT
GAUZE 4X4 16PLY RFD (DISPOSABLE) ×2 IMPLANT
GAUZE SPONGE 4X4 12PLY STRL (GAUZE/BANDAGES/DRESSINGS) ×2 IMPLANT
GLOVE BIOGEL PI IND STRL 8.5 (GLOVE) ×1 IMPLANT
GLOVE BIOGEL PI INDICATOR 8.5 (GLOVE) ×1
GLOVE SURG ORTHO 8.0 STRL STRW (GLOVE) ×2 IMPLANT
GOWN STRL REUS W/ TWL LRG LVL3 (GOWN DISPOSABLE) ×2 IMPLANT
GOWN STRL REUS W/ TWL XL LVL3 (GOWN DISPOSABLE) ×1 IMPLANT
GOWN STRL REUS W/TWL LRG LVL3 (GOWN DISPOSABLE) ×2
GOWN STRL REUS W/TWL XL LVL3 (GOWN DISPOSABLE) ×1
KIT BASIN OR (CUSTOM PROCEDURE TRAY) ×2 IMPLANT
KIT TURNOVER KIT B (KITS) ×2 IMPLANT
MANIFOLD NEPTUNE II (INSTRUMENTS) ×2 IMPLANT
NEEDLE HYPO 25X1 1.5 SAFETY (NEEDLE) ×2 IMPLANT
NS IRRIG 1000ML POUR BTL (IV SOLUTION) ×2 IMPLANT
PACK ORTHO EXTREMITY (CUSTOM PROCEDURE TRAY) ×2 IMPLANT
PAD ARMBOARD 7.5X6 YLW CONV (MISCELLANEOUS) ×4 IMPLANT
PAD CAST 3X4 CTTN HI CHSV (CAST SUPPLIES) ×1 IMPLANT
PAD CAST 4YDX4 CTTN HI CHSV (CAST SUPPLIES) ×1 IMPLANT
PADDING CAST COTTON 3X4 STRL (CAST SUPPLIES) ×1
PADDING CAST COTTON 4X4 STRL (CAST SUPPLIES) ×1
SCREW NL 1.5X11 WRIST (Screw) ×2 IMPLANT
SCREW NL 1.5X12 (Screw) ×4 IMPLANT
SOAP 2 % CHG 4 OZ (WOUND CARE) ×2 IMPLANT
STRIP CLOSURE SKIN 1/2X4 (GAUZE/BANDAGES/DRESSINGS) ×2 IMPLANT
SUT ETHILON 4 0 PS 2 18 (SUTURE) IMPLANT
SUT MNCRL AB 4-0 PS2 18 (SUTURE) ×2 IMPLANT
SUT PROLENE 4 0 PS 2 18 (SUTURE) ×2 IMPLANT
SUT VIC AB 2-0 FS1 27 (SUTURE) ×2 IMPLANT
SUT VICRYL 4-0 PS2 18IN ABS (SUTURE) IMPLANT
SYR CONTROL 10ML LL (SYRINGE) ×2 IMPLANT
TOWEL OR 17X26 10 PK STRL BLUE (TOWEL DISPOSABLE) ×2 IMPLANT
TUBE CONNECTING 12X1/4 (SUCTIONS) ×2 IMPLANT
YANKAUER SUCT BULB TIP NO VENT (SUCTIONS) IMPLANT

## 2019-01-14 NOTE — Anesthesia Postprocedure Evaluation (Signed)
Anesthesia Post Note  Patient: Christy Woodward  Procedure(s) Performed: RIGHT HAND THIRD METACARPAL CLOSED REDUCTION AND PERCUTANEOUS PINNING, POSSIBLE OPEN REDUCTION INTERNAL FIXATION (ORIF) METACARPAL (Right Hand)     Patient location during evaluation: PACU Anesthesia Type: General Level of consciousness: awake and alert Pain management: satisfactory to patient Vital Signs Assessment: post-procedure vital signs reviewed and stable Respiratory status: spontaneous breathing, nonlabored ventilation, respiratory function stable and patient connected to nasal cannula oxygen Cardiovascular status: blood pressure returned to baseline and stable Postop Assessment: no apparent nausea or vomiting Anesthetic complications: no    Last Vitals:  Vitals:   01/14/19 1647 01/14/19 1648  BP: 136/79   Pulse: (!) 104   Resp:    Temp:  36.5 C  SpO2: 98%     Last Pain:  Vitals:   01/14/19 1659  TempSrc:   PainSc: 6                  Beryle Lathehomas E Brock

## 2019-01-14 NOTE — Discharge Instructions (Signed)
KEEP [facility] CALL OFFICE FOR F/U APPT [phone] IN 11 DAYS WITH PA BARTON ALSO ASK FOR THERAPY APPT FOR SPLINT TO BE MADE RX SENT TO [facility]/MARKET KEEP HAND ELEVATED ABOVE HEART OK TO APPLY ICE TO OPERATIVE AREA CONTACT OFFICE IF ANY WORSENING PAIN OR CONCERNS.

## 2019-01-14 NOTE — H&P (Signed)
Christy Woodward is an 28 y.o. female.   Chief Complaint: RIGHT HAND INJURY   HPI: Christy Woodward is a 28y/o right hand dominant female who injured her right hand on 01/07/19 while doing aerial yoga. She had immediate pain, swelling, and weakness of the hand. She was seen by the urgent care and was splinted.  She was seen in our office for further evaluation. Discussed the reason and rationale for surgery and the use of either pins or a plate and screws for internal fixation. She was placed in a splint an advised to keep it clean and dry.  The patient is here today for surgery.  She denies chest pain, shortness of breath, nausea, vomiting, diarrhea, fever, or chills.    History reviewed. No pertinent past medical history.  History reviewed. No pertinent surgical history.  Family History  Problem Relation Age of Onset  . Hypertension Mother   . Hyperlipidemia Mother   . Hyperlipidemia Maternal Grandmother   . Hypertension Maternal Grandmother    Social History:  reports that she has never smoked. She has never used smokeless tobacco. She reports that she does not drink alcohol or use drugs.  Allergies: No Known Allergies  Medications Prior to Admission  Medication Sig Dispense Refill  . HYDROcodone-acetaminophen (NORCO/VICODIN) 5-325 MG tablet Take 1 tablet by mouth every 4 (four) hours as needed for moderate pain. 10 tablet 0  . ibuprofen (ADVIL,MOTRIN) 200 MG tablet Take 400 mg by mouth every 8 (eight) hours as needed.    . Multiple Vitamin (MULTIVITAMIN WITH MINERALS) TABS tablet Take 1 tablet by mouth every evening.    . promethazine (PHENERGAN) 25 MG tablet Take 1 tablet (25 mg total) by mouth every 8 (eight) hours as needed for up to 3 days for nausea or vomiting. (Patient not taking: Reported on 01/11/2019) 15 tablet 0    Results for orders placed or performed during the hospital encounter of 01/14/19 (from the past 48 hour(s))  Pregnancy, urine POC     Status: None   Collection Time:  01/14/19  1:46 PM  Result Value Ref Range   Preg Test, Ur NEGATIVE NEGATIVE    Comment:        THE SENSITIVITY OF THIS METHODOLOGY IS >24 mIU/mL   CBC     Status: Abnormal   Collection Time: 01/14/19  1:54 PM  Result Value Ref Range   WBC 9.1 4.0 - 10.5 K/uL   RBC 4.21 3.87 - 5.11 MIL/uL   Hemoglobin 11.9 (L) 12.0 - 15.0 g/dL   HCT 16.137.1 09.636.0 - 04.546.0 %   MCV 88.1 80.0 - 100.0 fL   MCH 28.3 26.0 - 34.0 pg   MCHC 32.1 30.0 - 36.0 g/dL   RDW 40.914.8 81.111.5 - 91.415.5 %   Platelets 305 150 - 400 K/uL   nRBC 0.0 0.0 - 0.2 %    Comment: Performed at [facility], [address] [place], [place] [address]   No results found.  ROS NO RECENT ILLNESSES OR HOSPITALIZATIONS  Blood pressure (!) 151/84, pulse 92, temperature 97.8 F (36.6 C), temperature source Oral, resp. rate 17, height 5' 5.5" (1.664 m), weight 92.1 kg, last menstrual period 12/18/2018, SpO2 100 %. Physical Exam  General Appearance:  Alert, cooperative, no distress, appears stated age  Head:  Normocephalic, without obvious abnormality, atraumatic  Eyes:  Pupils equal, conjunctiva/corneas clear,         Throat: Lips, mucosa, and tongue normal; teeth and gums normal  Neck: No  visible masses     Lungs:   respirations unlabored  Chest Wall:  No tenderness or deformity  Heart:  Regular rate and rhythm,  Abdomen:   Soft, non-tender,         Extremities: RUE: MODERATE SWELLING OF THE HAND WITH NO OPEN WOUNDS, ERYTHEMA, OR DRAINAGE. SENSATION INTACT TO LIGHT TOUCH DISTALLY. CAPILLARY REFILL LESS THAN 2 SECONDS. ABLE TO WIGGLE ALL DIGITS. TENDERNESS TO PALPATION OF THE THIRD METACARPAL.  Pulses: 2+ and symmetric  Skin: Skin color, texture, turgor normal, no rashes or lesions     Neurologic: Normal    Assessment RIGHT HAND THIRD METACARPAL SHAFT FRACTURE   Plan RIGHT HAND THIRD METACARPAL CLOSED REDUCTION AND PERCUTANEOUS PINNING WITH POSSIBLE OPEN REDUCTION AND INTERNAL FIXATION   WE ARE PLANNING SURGERY FOR YOUR  UPPER EXTREMITY. THE RISKS AND BENEFITS OF SURGERY INCLUDE BUT NOT LIMITED TO BLEEDING INFECTION, DAMAGE TO NEARBY NERVES ARTERIES TENDONS, FAILURE OF SURGERY TO ACCOMPLISH ITS INTENDED GOALS, PERSISTENT SYMPTOMS AND NEED FOR FURTHER SURGICAL INTERVENTION. WITH THIS IN MIND WE WILL PROCEED. I HAVE DISCUSSED WITH THE PATIENT THE PRE AND POSTOPERATIVE REGIMEN AND THE DOS AND DON'TS. PT VOICED UNDERSTANDING AND INFORMED CONSENT SIGNED.  R/B/A DISCUSSED WITH PT IN OFFICE.  PT VOICED UNDERSTANDING OF PLAN CONSENT SIGNED DAY OF SURGERY PT SEEN AND EXAMINED PRIOR TO OPERATIVE PROCEDURE/DAY OF SURGERY SITE MARKED. QUESTIONS ANSWERED WILL GO HOME FOLLOWING SURGERY  [facility] Services[facility]MANN MD 01/14/19    Karma GreaserSamantha Bonham Barton 01/14/2019, 3:19 PM

## 2019-01-14 NOTE — Transfer of Care (Signed)
Immediate Anesthesia Transfer of Care Note  Patient: Christy Woodward  Procedure(s) Performed: RIGHT HAND THIRD METACARPAL CLOSED REDUCTION AND PERCUTANEOUS PINNING, POSSIBLE OPEN REDUCTION INTERNAL FIXATION (ORIF) METACARPAL (Right Hand)  Patient Location: PACU  Anesthesia Type:General  Level of Consciousness: awake and patient cooperative  Airway & Oxygen Therapy: Patient Spontanous Breathing  Post-op Assessment: Report given to RN and Post -op Vital signs reviewed and stable  Post vital signs: Reviewed and stable  Last Vitals:  Vitals Value Taken Time  BP 136/79 01/14/2019  4:47 PM  Temp 36.5 C 01/14/2019  4:48 PM  Pulse 87 01/14/2019  4:57 PM  Resp 20 01/14/2019  4:57 PM  SpO2 100 % 01/14/2019  4:57 PM  Vitals shown include unvalidated device data.  Last Pain:  Vitals:   01/14/19 1648  TempSrc:   PainSc: 0-No pain      Patients Stated Pain Goal: 3 (01/14/19 1347)  Complications: No apparent anesthesia complications

## 2019-01-14 NOTE — Anesthesia Preprocedure Evaluation (Addendum)
Anesthesia Evaluation  Patient identified by MRN, date of birth, ID band Patient awake    Reviewed: Allergy & Precautions, NPO status , Patient's Chart, lab work & pertinent test results  History of Anesthesia Complications Negative for: history of anesthetic complications  Airway Mallampati: I  TM Distance: >3 FB Neck ROM: Full    Dental  (+) Dental Advisory Given, Teeth Intact   Pulmonary neg pulmonary ROS,    breath sounds clear to auscultation       Cardiovascular negative cardio ROS   Rhythm:Regular Rate:Normal     Neuro/Psych negative neurological ROS  negative psych ROS   GI/Hepatic negative GI ROS, Neg liver ROS,   Endo/Other  negative endocrine ROS  Renal/GU negative Renal ROS     Musculoskeletal negative musculoskeletal ROS (+)   Abdominal   Peds  Hematology negative hematology ROS (+)   Anesthesia Other Findings   Reproductive/Obstetrics                            Anesthesia Physical Anesthesia Plan  ASA: I  Anesthesia Plan: General   Post-op Pain Management:    Induction: Intravenous  PONV Risk Score and Plan: 4 or greater and Treatment may vary due to age or medical condition, Ondansetron, Midazolam and Dexamethasone  Airway Management Planned: LMA  Additional Equipment: None  Intra-op Plan:   Post-operative Plan: Extubation in OR  Informed Consent: I have reviewed the patients History and Physical, chart, labs and discussed the procedure including the risks, benefits and alternatives for the proposed anesthesia with the patient or authorized representative who has indicated his/her understanding and acceptance.     Dental advisory given  Plan Discussed with: CRNA and Anesthesiologist  Anesthesia Plan Comments: (Discussed regional block to be done in PACU as needed)       Anesthesia Quick Evaluation

## 2019-01-14 NOTE — Op Note (Signed)
PREOPERATIVE DIAGNOSIS:comminuted right long finger metacarpal shaft fracture  POSTOPERATIVE DIAGNOSIS:same  ATTENDING SURGEON:Dr. Bradly BienenstockFred Samari Bittinger who was scrubbed and present for the entire procedure  ASSISTANT SURGEON:Samantha Willaim RayasBarton PA-C was scrubbed and necessary for open reduction internal fixation closure and splinting in a timely fashion  ANESTHESIA:Gen. Via LMA  OPERATIVE PROCEDURE: #1: Open treatment of displaced right long finger metacarpal shaft fracture requiring internal fixation #2: Radiographs 3 views right hand  IMPLANTS:3 1.5 mm screws from the Biomet hand set, lag screw fixation  RADIOGRAPHIC INTERPRETATION:AP lateral oblique views of the hand do show the screw fixation along the metacarpal shaft with good alignment in all planes  SURGICAL INDICATIONS:patient is a right-hand-dominant female who sustained a closed injury to her right hand. Patient seen and evaluated in the office and recommended undergo the above procedure. Risks of surgery include but not limited to bleeding infection damage to nearby nerves arteries or tendons loss of motion of the wrists and digits incomplete relief of symptoms and need for further surgical intervention  SURGICAL TECHNIQUE:patient is properly divided the preoperative holding area marked for permanent marker made on the right hand indicate correct operative site. Patient brought back to operating room placed supine on anesthesia and table where the general anesthetic was administered. Patient tolerated this well. A well-padded tourniquet placed on the right brachium and sealed with the appropriate drape. The right upper extremities and prepped and draped in normal sterile fashion. Preoperative antibiotics were given prior any skin incision. A longitudinal incision made directly over the long finger metacarpal shaft. The limb was then elevated tourniquet insufflated. Dissection carried down through the skin and subcutaneous tissue. Extensor tendons  were then carefully protected and the fascial layer was incised longitudinally. The fracture site was then exposed. Open reduction was then performed with route removal of the fracture hematoma and the fracture was held in place with small reduction clamps. This was a short oblique fracture amenable to lag screw fixation. Following this 3 1.5 mm lag screws were then placed by over drilling the near cortex and the 1.1 mm drill bit for the far cortex. Countersink an appropriate depth gauge measurement was carried out and the 3 screws obtained good purchase with good fixation. These were placed in slightly different planes. The wounds were then thoroughly irrigated. Final radiographs were obtained. The fascial layer was then closed with 2-0 Vicryl. Subcutaneous tissues closed with Monocryl. The skin closed a running 4-0 Prolene subcuticular. Steri-Strips were applied. Sterile compressive bandage then applied. The patient is then placed in well-padded volar splint immobilizing the MP joints. 10 mL accord percent Marcaine infiltrated locally. Patient was then extubated taken recovery room in good condition.  POSTOPERATIVE PLAN:patient be discharged to home. Seen back in the office in 11 days for wound check suture removal x-rays down to see her therapist begin a postoperative ORIF of the metacarpal shaft with plates and screws construct. Radiographs at each visit. See her back at the four-week mark.

## 2019-01-14 NOTE — Anesthesia Procedure Notes (Signed)
Procedure Name: LMA Insertion Date/Time: 01/14/2019 3:46 PM Performed by: Adonis Housekeeper, CRNA Pre-anesthesia Checklist: Patient identified, Emergency Drugs available, Suction available and Patient being monitored Patient Re-evaluated:Patient Re-evaluated prior to induction Oxygen Delivery Method: Circle system utilized Preoxygenation: Pre-oxygenation with 100% oxygen Induction Type: IV induction LMA: LMA inserted LMA Size: 4.0 Number of attempts: 1 Placement Confirmation: positive ETCO2 and breath sounds checked- equal and bilateral Tube secured with: Tape Dental Injury: Teeth and Oropharynx as per pre-operative assessment

## 2019-01-15 ENCOUNTER — Encounter (HOSPITAL_COMMUNITY): Payer: Self-pay | Admitting: Orthopedic Surgery

## 2019-01-29 DIAGNOSIS — M25641 Stiffness of right hand, not elsewhere classified: Secondary | ICD-10-CM | POA: Diagnosis not present

## 2019-01-29 DIAGNOSIS — S62352D Nondisplaced fracture of shaft of third metacarpal bone, right hand, subsequent encounter for fracture with routine healing: Secondary | ICD-10-CM | POA: Diagnosis not present

## 2019-02-06 DIAGNOSIS — M25641 Stiffness of right hand, not elsewhere classified: Secondary | ICD-10-CM | POA: Diagnosis not present

## 2019-02-15 DIAGNOSIS — M25641 Stiffness of right hand, not elsewhere classified: Secondary | ICD-10-CM | POA: Diagnosis not present

## 2019-02-21 DIAGNOSIS — Z4789 Encounter for other orthopedic aftercare: Secondary | ICD-10-CM | POA: Diagnosis not present

## 2019-02-21 DIAGNOSIS — S62322D Displaced fracture of shaft of third metacarpal bone, right hand, subsequent encounter for fracture with routine healing: Secondary | ICD-10-CM | POA: Diagnosis not present

## 2019-07-02 DIAGNOSIS — Z20828 Contact with and (suspected) exposure to other viral communicable diseases: Secondary | ICD-10-CM | POA: Diagnosis not present

## 2019-12-19 DIAGNOSIS — Z20828 Contact with and (suspected) exposure to other viral communicable diseases: Secondary | ICD-10-CM | POA: Diagnosis not present

## 2020-01-03 IMAGING — DX DG HAND COMPLETE 3+V*R*
3 series · 3 of 3 positions shown · non-contrast
Comparison: None.

CLINICAL DATA: 28-year-old female with right hand pain after
injuring her hand in a yoga class

EXAM:
RIGHT HAND - COMPLETE 3+ VIEW

[hand pa]
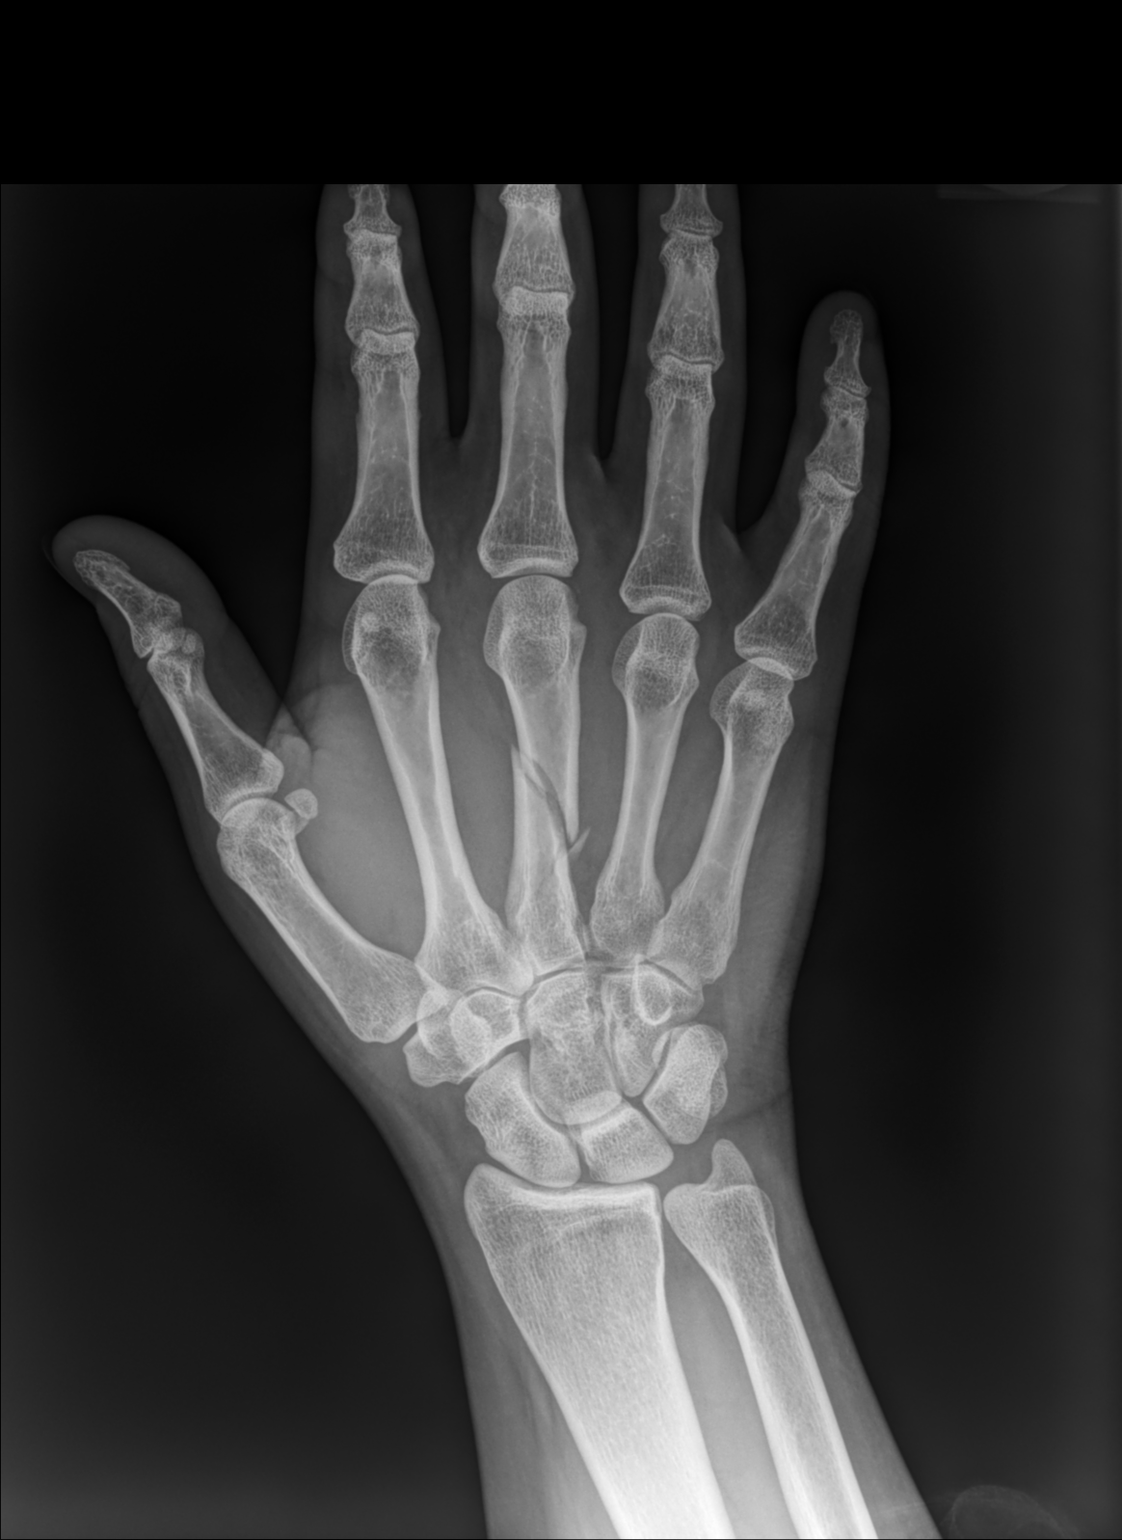

[hand mlo]
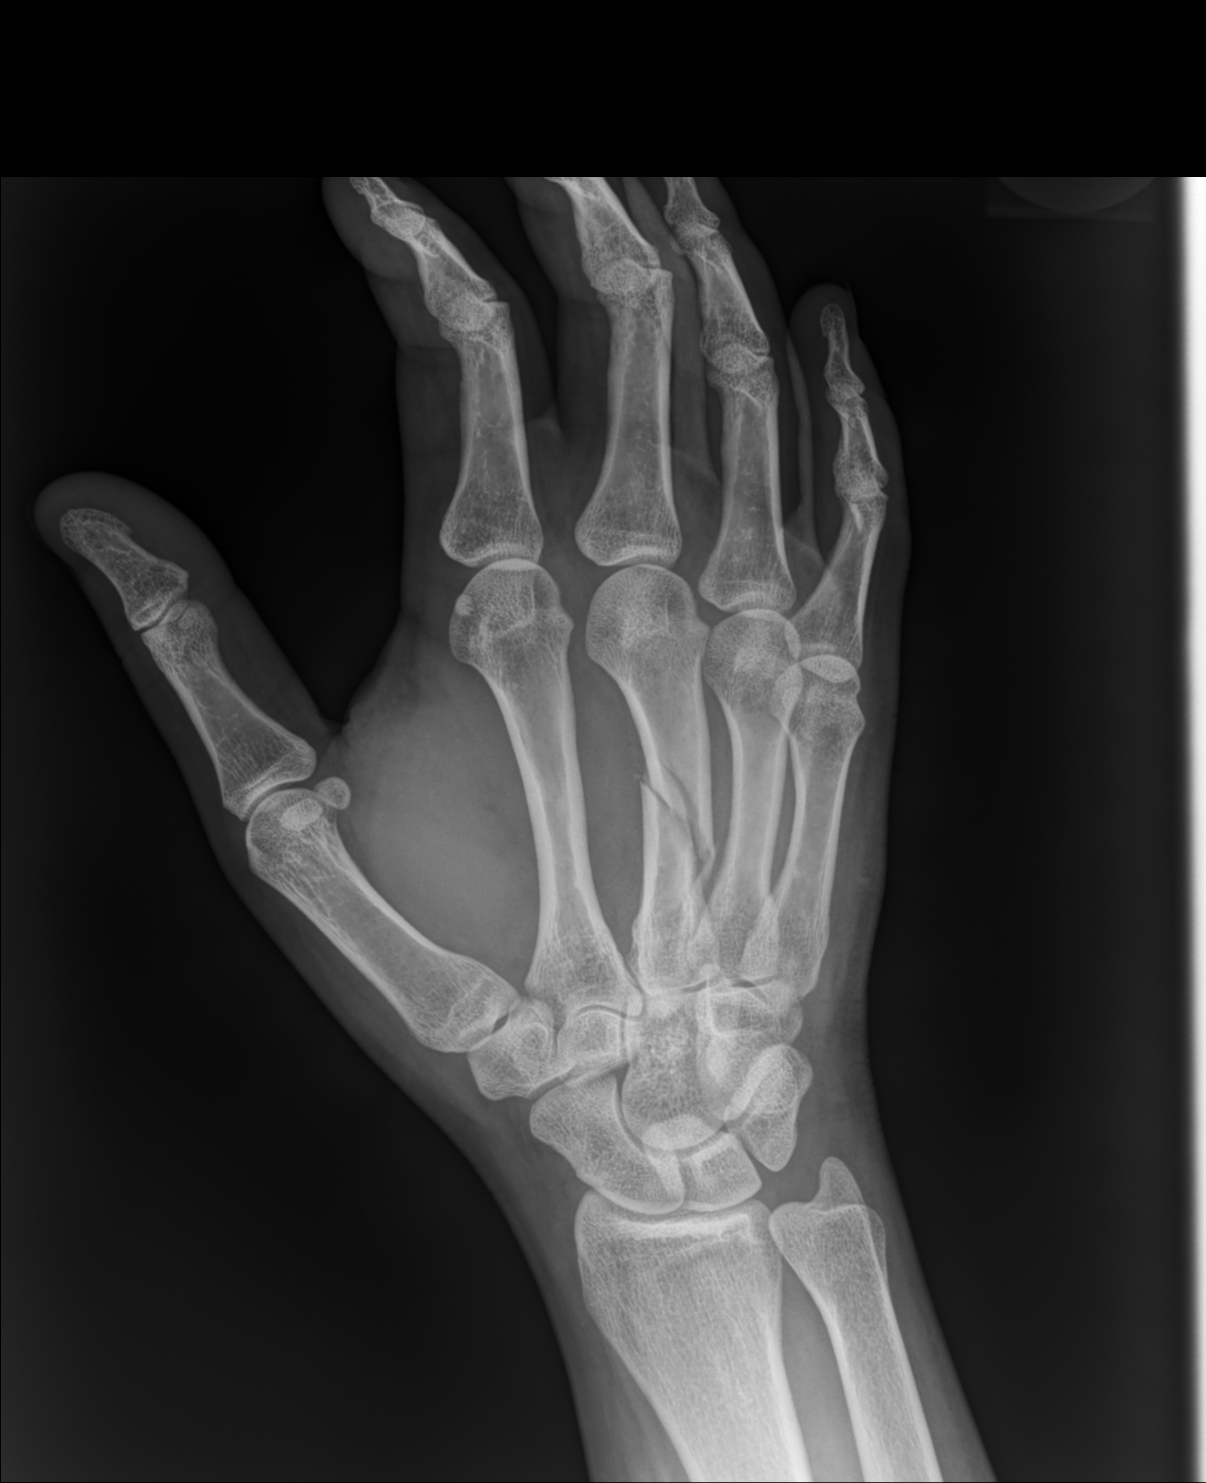

[hand lat]
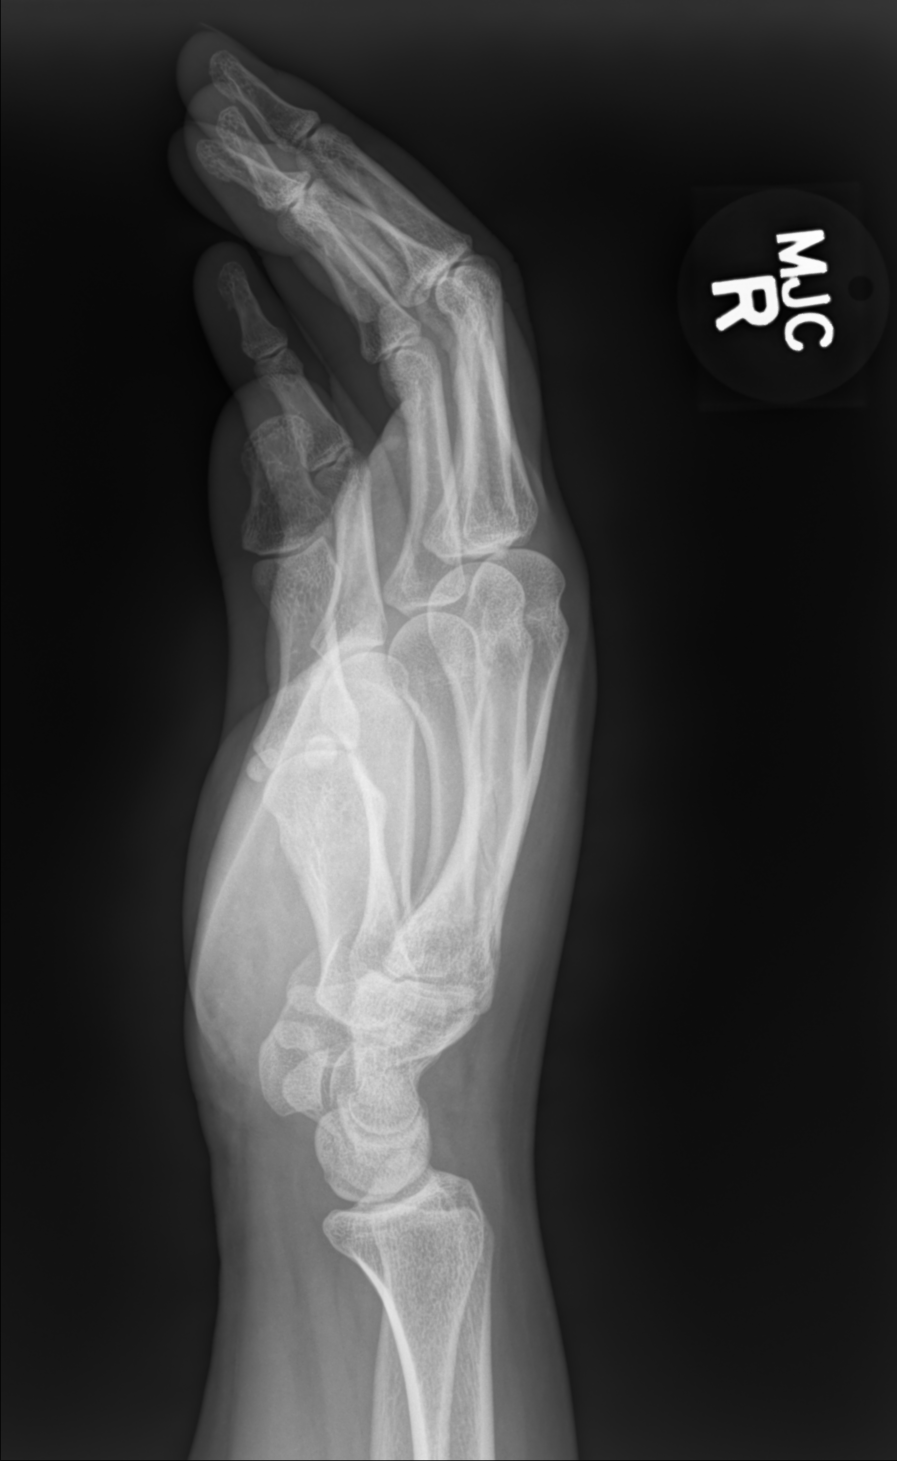

[3 of 3 positions shown; findings below may reference images not displayed]

FINDINGS: Acute obliquely oriented and mildly comminuted fracture through the
mid aspect of the third metacarpal. There is associated soft tissue
swelling about the hand. The remaining visualized bones and joints
appear intact and unremarkable.
IMPRESSION: Acute mildly comminuted obliquely oriented fracture through the mid
aspect of the third metacarpal.

## 2020-03-04 ENCOUNTER — Ambulatory Visit: Payer: Managed Care, Other (non HMO) | Admitting: Allergy

## 2020-03-04 ENCOUNTER — Encounter: Payer: Self-pay | Admitting: Allergy

## 2020-03-04 ENCOUNTER — Other Ambulatory Visit: Payer: Self-pay

## 2020-03-04 VITALS — BP 124/82 | HR 102 | Temp 97.8°F | Resp 18 | Ht 66.0 in | Wt 203.8 lb

## 2020-03-04 DIAGNOSIS — L282 Other prurigo: Secondary | ICD-10-CM

## 2020-03-04 NOTE — Progress Notes (Signed)
New Patient Note  RE: Christy Woodward MRN: [id number] DOB: 03/17/1991 Date of Office Visit: 03/04/2020  Referring provider: Soundra Pilon, FNP Primary care provider: Soundra Pilon, FNP  Chief Complaint: rash  History of Present Illness: I had the pleasure of seeing Christy Woodward for initial evaluation at the [facility] on 03/04/2020. She is a 29 y.o. female, who is referred here by Soundra Pilon, FNP for the evaluation of hives.  Patient works as a travel Engineer, civil (consulting).  She started taking a new type of gummies for hair, skin and nails with biotin in February. She started to break out in a rash about 2 days afterwards.   Mainly occurs on her arms, torso. Describes them as pruritic, raised, erythematous. Individual rashes lasts about 4-5 days. Currently has some hyperpigmented areas where the rashes were. Associated symptoms include: none. Suspected triggers are biotin gummies. She had symptoms for about 1 month. Denies any fevers, chills, foods, personal care products or recent infections. She also started melatonin gummies which she is still taking right now along with her multivitamin.    She has tried the following therapies: Claritin, benadryl and hydrocortisone with minimal benefit. Systemic steroids no.  Previous work up includes: none.  Previous history of rash/hives: yes around August 2018. She started to take the same gummies and didn't have issues for about 1 month however describes the rash as different than current rash. At that time she was under a lot of stress and also thought she was having issues with the polyester scrubs. Symptoms usually lasted for about 1 day.  Sometimes she still has breakouts if she is under stress. She is wearing less polyester and changed her bedding material to polyester free items.   Patient is up to date with the following cancer screening tests: not up to date with pap.  She also tried to eliminate gluten from her diet which  seems to flare her symptoms.   Labs to date: CBC - anemia,  CMP, TSH - normal Vitamin D - low  Assessment and Plan: Christy Woodward is a 29 y.o. female with: Pruritic rash Broke out in a pruritic rash in February after taking Biotin gummy supplements. Slowly improving. Minimal benefit with Claritin, benadryl and topical hydrocortisone. Eliminated gluten from diet as well which seems to be helping. Broke out in a different rash from biotin about 3 years ago but also thought that episode was due to increased stress and polyester. Denies changes in personal care products or recent infections.   Today's skin testing showed: Negative to wheat, milk, barley, oat, rye, pork, beef and corn.  Discussed with patient given clinical history, it's most likely that the rash was triggered by the biotin gummies.  Continue to avoid gluten and dairy products as they seem to be helping not only the rash but some of her GI/menstrual cramp symptoms.   Continue proper skin care.  Minimize polyester contact.  . Get bloodwork to rule out other etiologies.   If you have another break out, take pictures and let us know.  You may take zyrtec 10mg  twice a day to help with the itching during a flare.   You may try topical benadryl cream or hydrocortisone cream as well.   Return if symptoms worsen or fail to improve.  Lab Orders     Alpha-Gal Panel     ANA w/Reflex     C-reactive protein     Sedimentation rate  Tryptase     Chronic Urticaria     Gelatin,IgE  Other allergy screening: Asthma: yes as a child only.  Rhino conjunctivitis: no Food allergy: no Medication allergy: no Hymenoptera allergy: no Eczema:no History of recurrent infections suggestive of immunodeficency: no  Diagnostics: Skin Testing: Select foods. Negative test to: foods as below.  Results discussed with patient/family. Food Adult Perc - 03/04/20 0900    Time Antigen Placed  1761    Allergen Manufacturer  Waynette Buttery    Location  Arm      Number of allergen test  11     Control-buffer 50% Glycerol  Negative    Control-Histamine 1 mg/ml  2+    3. Wheat  Negative    5. Milk, cow  Negative    30. Barley  Negative    31. Oat   Negative    32. Rye   Negative    33. Hops  Negative    37. Pork  Negative    40. Beef  Negative    53. Corn  Negative       Past Medical History: Patient Active Problem List   Diagnosis Date Noted  . Pruritic rash 03/04/2020   History reviewed. No pertinent past medical history. Past Surgical History: Past Surgical History:  Procedure Laterality Date  . OPEN REDUCTION INTERNAL FIXATION (ORIF) METACARPAL Right 01/14/2019   Procedure: RIGHT HAND THIRD METACARPAL CLOSED REDUCTION AND PERCUTANEOUS PINNING, POSSIBLE OPEN REDUCTION INTERNAL FIXATION (ORIF) METACARPAL;  Surgeon: Bradly Bienenstock, MD;  Location: [facility] OR;  Service: Orthopedics;  Laterality: Right;   Medication List:  Current Outpatient Medications  Medication Sig Dispense Refill  . ibuprofen (ADVIL,MOTRIN) 200 MG tablet Take 400 mg by mouth every 8 (eight) hours as needed.    . Melatonin 5 MG CHEW Chew 1 tablet by mouth at bedtime.    . Multiple Vitamin (MULTIVITAMIN WITH MINERALS) TABS tablet Take 1 tablet by mouth every evening.    . Vitamin D, Ergocalciferol, (DRISDOL) 1.25 MG (50000 UNIT) CAPS capsule Take 50,000 Units by mouth every 7 (seven) days.    . promethazine (PHENERGAN) 25 MG tablet Take 1 tablet (25 mg total) by mouth every 8 (eight) hours as needed for up to 3 days for nausea or vomiting. (Patient not taking: Reported on 01/11/2019) 15 tablet 0   No current facility-administered medications for this visit.   Allergies: No Known Allergies Social History: Social History   Socioeconomic History  . Marital status: Single    Spouse name: Not on file  . Number of children: Not on file  . Years of education: Not on file  . Highest education level: Not on file  Occupational History  . Not on file  Tobacco Use  .  Smoking status: Never Smoker  . Smokeless tobacco: Never Used  Substance and Sexual Activity  . Alcohol use: No  . Drug use: Never  . Sexual activity: Not on file  Other Topics Concern  . Not on file  Social History Narrative  . Not on file   Social Determinants of Health   Financial Resource Strain:   . Difficulty of Paying Living Expenses:   Food Insecurity:   . Worried About Programme researcher, broadcasting/film/video in the Last Year:   . Barista in the Last Year:   Transportation Needs:   . Freight forwarder (Medical):   Marland Kitchen Lack of Transportation (Non-Medical):   Physical Activity:   . Days of Exercise per Week:   .  Minutes of Exercise per Session:   Stress:   . Feeling of Stress :   Social Connections:   . Frequency of Communication with Friends and Family:   . Frequency of Social Gatherings with Friends and Family:   . Attends Religious Services:   . Active Member of Clubs or Organizations:   . Attends Archivist Meetings:   Marland Kitchen Marital Status:    Lives in a [address] year old townhome. Smoking: Denies Occupation: Therapist, sports HistoryFreight forwarder in the house: no Charity fundraiser in the family room: yes Carpet in the bedroom: yes Heating: gas Cooling: central Pet: no  Family History: Family History  Problem Relation Age of Onset  . Hypertension Mother   . Hyperlipidemia Mother   . Hyperlipidemia Maternal Grandmother   . Hypertension Maternal Grandmother    Problem                               Relation Asthma                                   No  Eczema                                Brother  Food allergy                          No  Allergic rhino conjunctivitis     Mother, sister  Review of Systems  Constitutional: Negative for appetite change, chills, fever and unexpected weight change.  HENT: Negative for congestion and rhinorrhea.   Eyes: Negative for itching.  Respiratory: Negative for cough, chest tightness, shortness of breath and wheezing.    Cardiovascular: Negative for chest pain.  Gastrointestinal: Negative for abdominal pain.  Genitourinary: Negative for difficulty urinating.  Skin: Positive for rash.  Allergic/Immunologic: Negative for environmental allergies and food allergies.  Neurological: Negative for headaches.   Objective: BP 124/82 (BP Location: Right Arm, Patient Position: Sitting, Cuff Size: Normal)   Pulse (!) 102   Temp 97.8 F (36.6 C) (Temporal)   Resp 18   Ht 5\' 6"  (1.676 m)   Wt 203 lb 12.8 oz (92.4 kg)   SpO2 96%   BMI 32.89 kg/m  Body mass index is 32.89 kg/m. Physical Exam  Constitutional: She is oriented to person, place, and time. She appears well-developed and well-nourished.  HENT:  Head: Normocephalic and atraumatic.  Right Ear: External ear normal.  Left Ear: External ear normal.  Nose: Nose normal.  Mouth/Throat: Oropharynx is clear and moist.  Eyes: Conjunctivae and EOM are normal.  Cardiovascular: Normal rate, regular rhythm and normal heart sounds. Exam reveals no gallop and no friction rub.  No murmur heard. Pulmonary/Chest: Effort normal and breath sounds normal. She has no wheezes. She has no rales.  Abdominal: Soft.  Musculoskeletal:     Cervical back: Neck supple.  Neurological: She is alert and oriented to person, place, and time.  Skin: Skin is warm. Rash noted.  Few scattered circular hyperpigmented areas on upper extremities b/l.  Psychiatric: She has a normal mood and affect. Her behavior is normal.  Nursing note and vitals reviewed.  The plan was reviewed with the patient/family, and all questions/concerned were addressed.  It was my pleasure to see Chasty today  and participate in her care. Please feel free to contact me with any questions or concerns.  Sincerely,  Rexene Alberts, DO Allergy & Immunology  [facility] office: [phone] [facility] office: [place] office: [phone]

## 2020-03-04 NOTE — Assessment & Plan Note (Addendum)
Broke out in a pruritic rash in February after taking Biotin gummy supplements. Slowly improving. Minimal benefit with Claritin, benadryl and topical hydrocortisone. Eliminated gluten from diet as well which seems to be helping. Broke out in a different rash from biotin about 3 years ago but also thought that episode was due to increased stress and polyester. Denies changes in personal care products or recent infections.   Today's skin testing showed: Negative to wheat, milk, barley, oat, rye, pork, beef and corn.  Discussed with patient given clinical history, it's most likely that the rash was triggered by the biotin gummies.  Continue to avoid gluten and dairy products as they seem to be helping not only the rash but some of her GI/menstrual cramp symptoms.   Continue proper skin care.  Minimize polyester contact.  . Get bloodwork to rule out other etiologies.   If you have another break out, take pictures and let us know.  You may take zyrtec 10mg  twice a day to help with the itching during a flare.   You may try topical benadryl cream or hydrocortisone cream as well.

## 2020-03-04 NOTE — Patient Instructions (Addendum)
Today's skin testing showed: Negative to wheat, milk, barley, oat, rye, pork, beef and corn.  I'm suspicious of the biotin gummies contributing to your rash.   If you feel better avoiding gluten and dairy products then continue to avoid.  You may try lactose free products.   Continue to avoid the biotin gummies.   See below for proper skin care.  Minimize polyester contact.  . Get bloodwork to rule out other etiologies.  o We are ordering labs, so please allow 1-2 weeks for the results to come back. o With the newly implemented Cures Act, the labs might be visible to you at the same time that they become visible to me. However, I will not address the results until all of the results are back, so please be patient.    If you have another break out, take pictures and let us know.  You may take zyrtec 10mg  twice a day to help with the itching.  You may try topical benadryl cream or hydrocortisone cream as well.   Follow up as needed.    Skin care recommendations  Bath time: . Always use lukewarm water. AVOID very hot or cold water. Keep bathing time to 5-10 minutes. . Do NOT use bubble bath. . Use a mild soap and use just enough to wash the dirty areas. . Do NOT scrub skin vigorously.  . After bathing, pat dry your skin with a towel. Do NOT rub or scrub the skin.  Moisturizers and prescriptions:  . ALWAYS apply moisturizers immediately after bathing (within 3 minutes). This helps to lock-in moisture. . Use the moisturizer several times a day over the whole body. Marland Kitchen summer moisturizers include: Aveeno, CeraVe, Cetaphil. [place] winter moisturizers include: Aquaphor, Vaseline, Cerave, Cetaphil, Eucerin, Vanicream. . When using moisturizers along with medications, the moisturizer should be applied about one hour after applying the medication to prevent diluting effect of the medication or moisturize around where you applied the medications. When not using medications, the  moisturizer can be continued twice daily as maintenance.  Laundry and clothing: . Avoid laundry products with added color or perfumes. . Use unscented hypo-allergenic laundry products such as Tide free, Cheer free & gentle, and All free and clear.  . If the skin still seems dry or sensitive, you can try double-rinsing the clothes. . Avoid tight or scratchy clothing such as wool. . Do not use fabric softeners or dyer sheets.

## 2020-03-11 LAB — ALPHA-GAL PANEL
Alpha Gal IgE*: <0.1 kU/L (ref <0.10)
Beef (Bos spp) IgE: <0.1 kU/L (ref <0.35)
Class Interpretation: 0
Class Interpretation: 0
Class Interpretation: 0
Lamb/Mutton (Ovis spp) IgE: <0.1 kU/L (ref <0.35)
Pork (Sus spp) IgE: <0.1 kU/L (ref <0.35)

## 2020-03-11 LAB — C074-IGE GELATIN: C074-IgE Gelatin: <0.1 kU/L

## 2020-03-11 LAB — ANA W/REFLEX: Anti Nuclear Antibody (ANA): NEGATIVE

## 2020-03-11 LAB — C-REACTIVE PROTEIN: CRP: 4 mg/L (ref 0–10)

## 2020-03-11 LAB — TRYPTASE: Tryptase: 3.7 ug/L (ref 2.2–13.2)

## 2020-03-11 LAB — SEDIMENTATION RATE: Sed Rate: 36 mm/hr — ABNORMAL HIGH (ref 0–32)

## 2020-03-11 LAB — CHRONIC URTICARIA: cu index: <1.4 (ref <10)

## 2020-10-23 ENCOUNTER — Other Ambulatory Visit: Payer: Self-pay | Admitting: Family Medicine

## 2020-10-23 DIAGNOSIS — R1011 Right upper quadrant pain: Secondary | ICD-10-CM

## 2020-10-30 ENCOUNTER — Other Ambulatory Visit: Payer: Self-pay

## 2020-10-30 ENCOUNTER — Ambulatory Visit (INDEPENDENT_AMBULATORY_CARE_PROVIDER_SITE_OTHER): Payer: Managed Care, Other (non HMO)

## 2020-10-30 DIAGNOSIS — R1011 Right upper quadrant pain: Secondary | ICD-10-CM | POA: Diagnosis not present

## 2021-05-03 DIAGNOSIS — Z01419 Encounter for gynecological examination (general) (routine) without abnormal findings: Secondary | ICD-10-CM | POA: Diagnosis not present

## 2021-05-04 DIAGNOSIS — L2089 Other atopic dermatitis: Secondary | ICD-10-CM | POA: Diagnosis not present

## 2021-10-08 DIAGNOSIS — J029 Acute pharyngitis, unspecified: Secondary | ICD-10-CM | POA: Diagnosis not present

## 2021-10-08 DIAGNOSIS — R52 Pain, unspecified: Secondary | ICD-10-CM | POA: Diagnosis not present

## 2021-10-08 DIAGNOSIS — R5383 Other fatigue: Secondary | ICD-10-CM | POA: Diagnosis not present

## 2021-10-26 IMAGING — US US ABDOMEN LIMITED
1 series · 14 of 25 positions shown · non-contrast
Comparison: None.

CLINICAL DATA: Right upper quadrant abdominal pain.

EXAM:
ULTRASOUND ABDOMEN LIMITED RIGHT UPPER QUADRANT

[Series 1: us abdomen limited · 0.11mm/px · 14 of 59 slices shown]
[im 1/59]
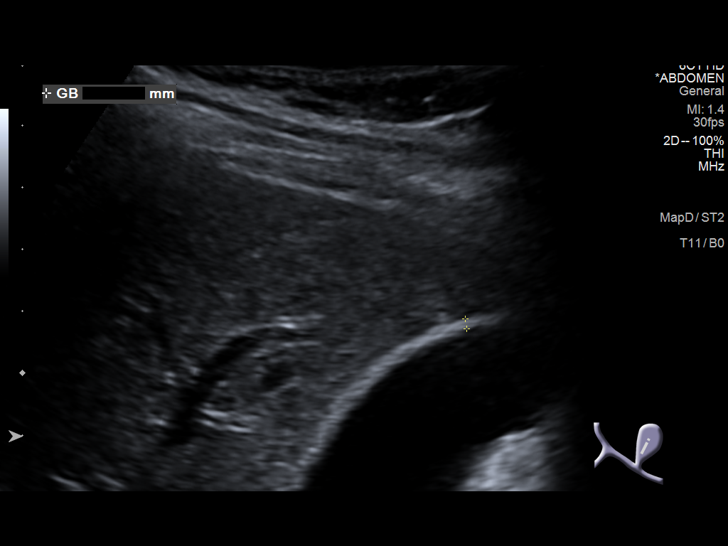
[im 5/59]
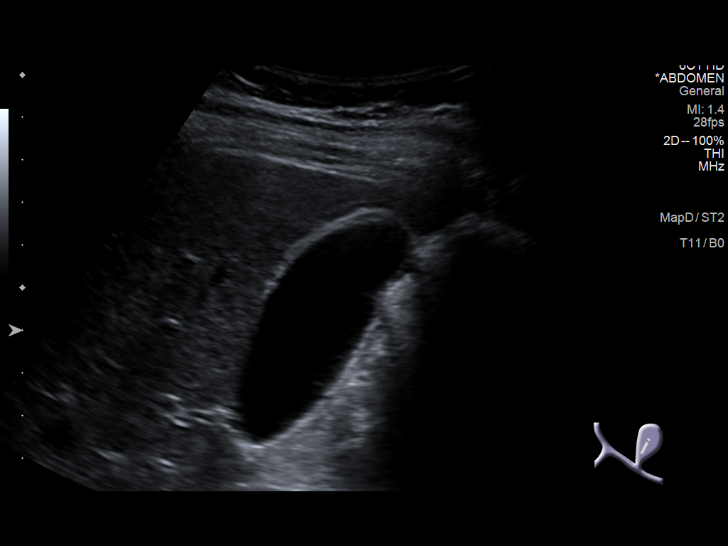
[im 10/59]
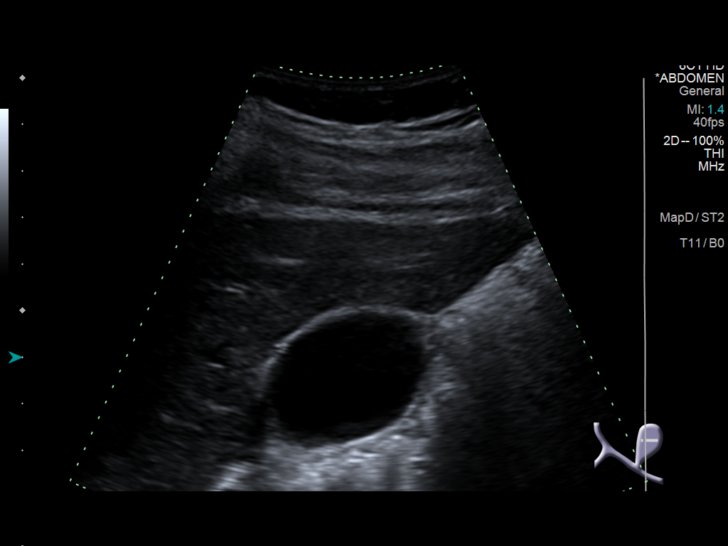
[im 15/59]
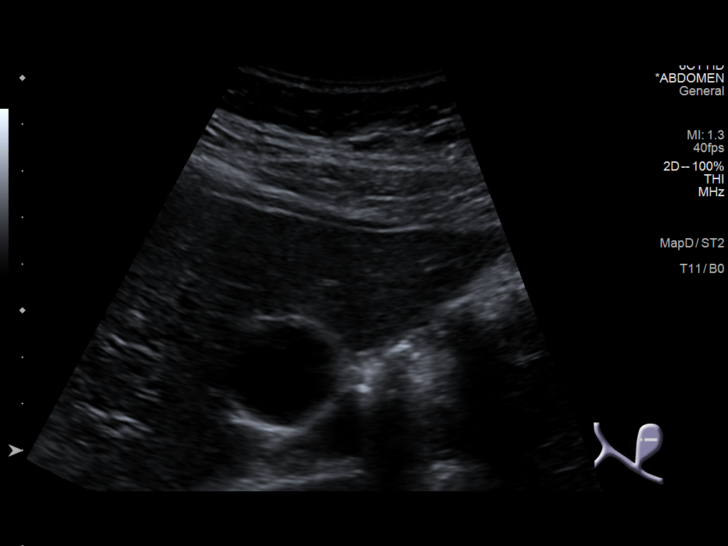
[im 20/59]
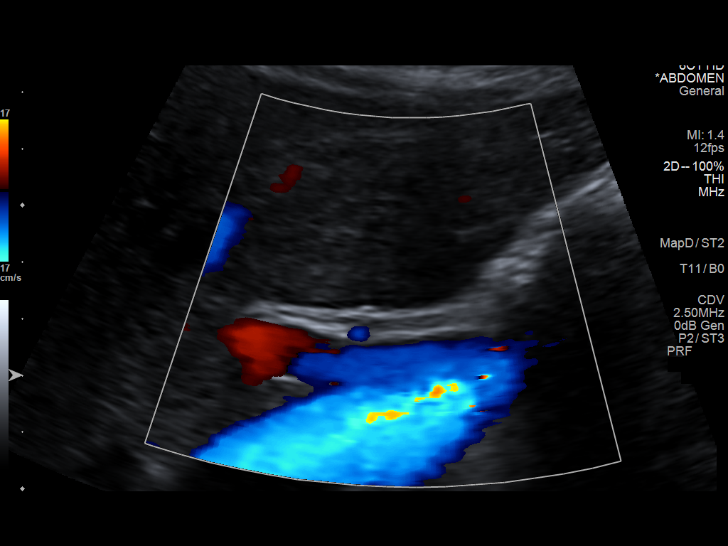
[im 22/59]
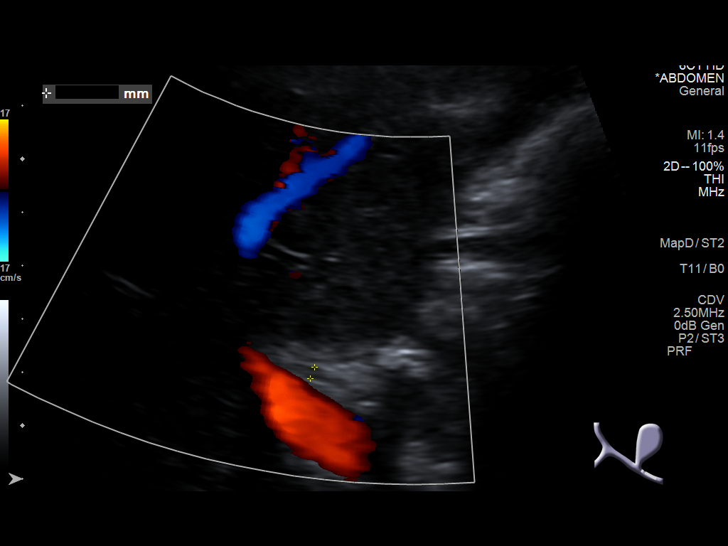
[im 27/59]
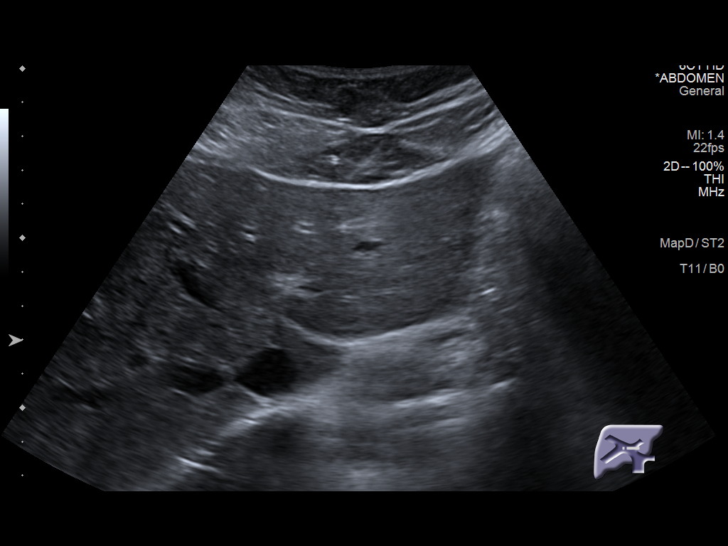
[im 32/59]
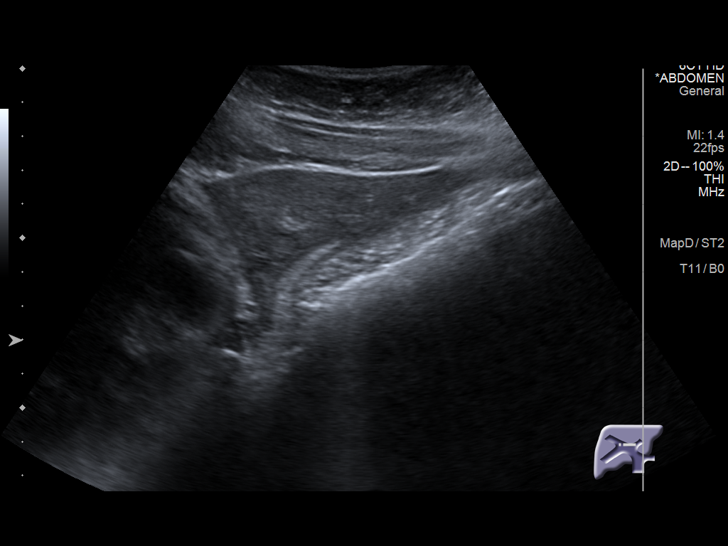
[im 37/59]
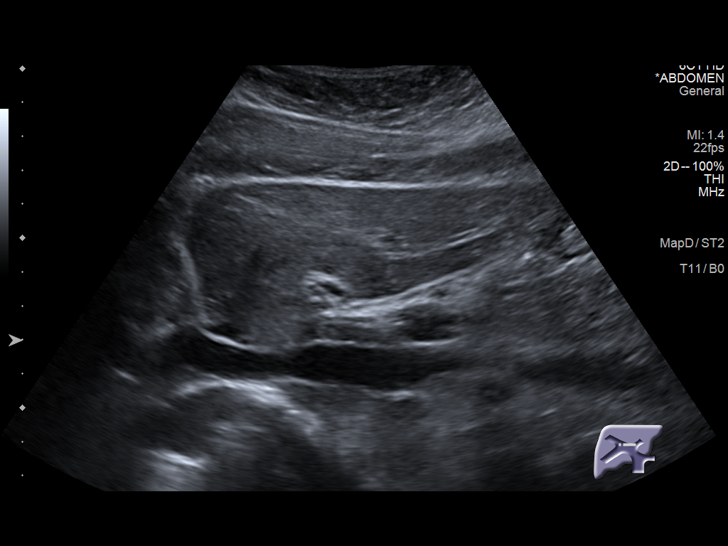
[im 39/59]
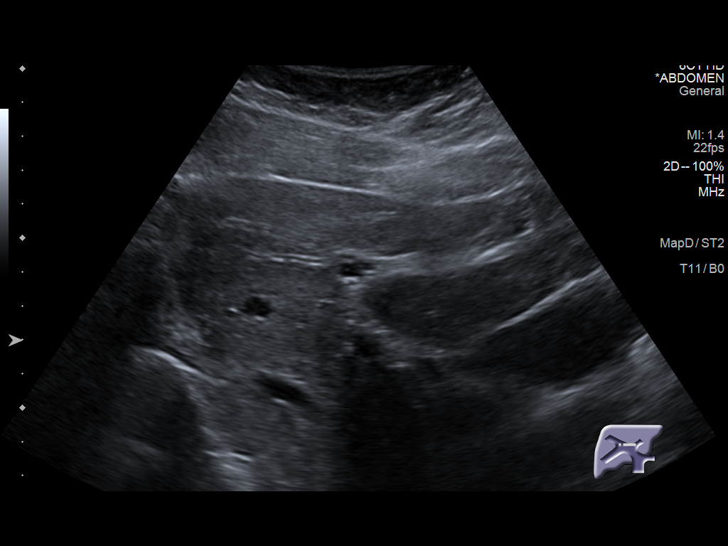
[im 44/59]
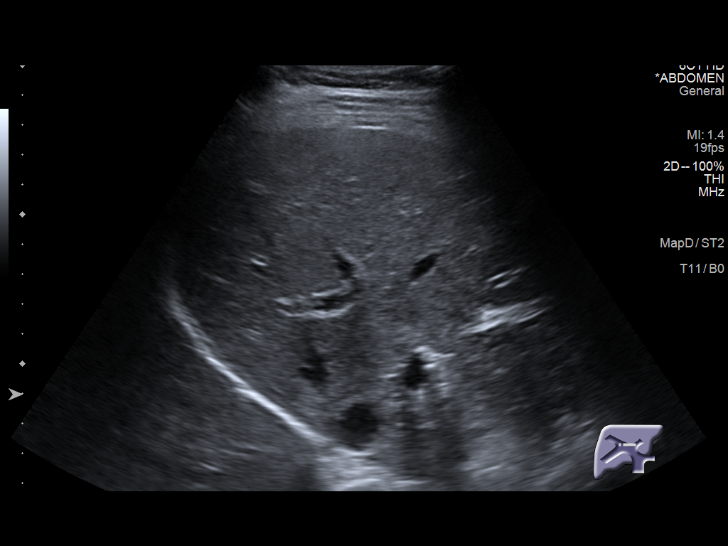
[im 49/59]
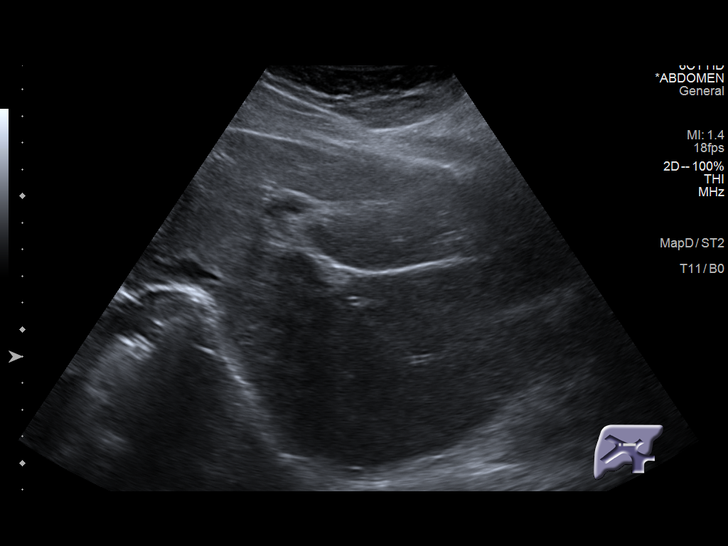
[im 54/59]
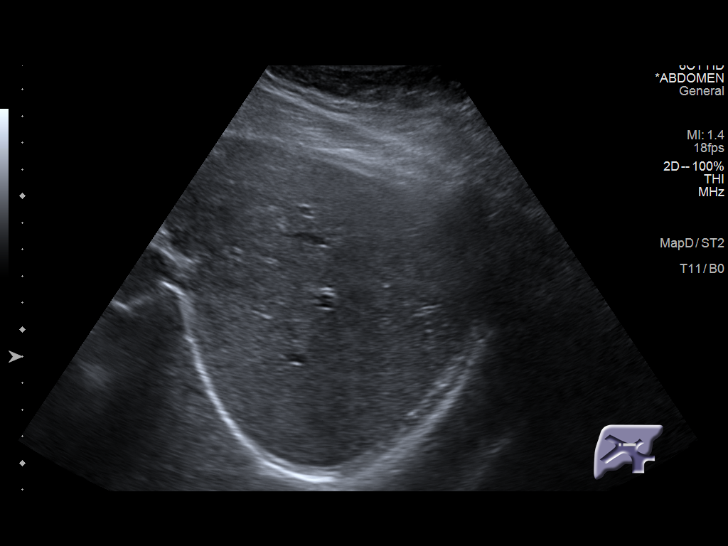
[im 59/59]
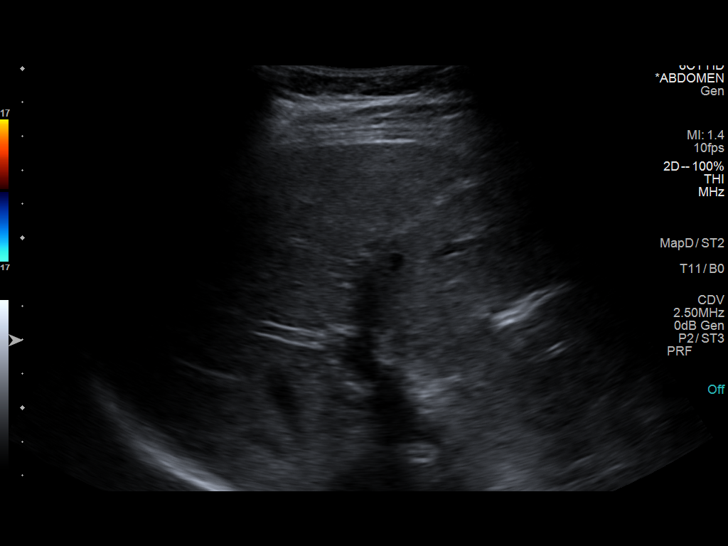

[14 of 25 positions shown; findings below may reference images not displayed]

FINDINGS: Gallbladder:

No gallstones or wall thickening visualized. No sonographic Murphy
sign noted by sonographer.

Common bile duct:

Diameter: 2.3 mm

Liver:

No focal lesion identified. Within normal limits in parenchymal
echogenicity. Portal vein is patent on color Doppler imaging with
normal direction of blood flow towards the liver.

Other: None.
IMPRESSION: Normal right upper quadrant ultrasound.

## 2022-03-02 DIAGNOSIS — Z0000 Encounter for general adult medical examination without abnormal findings: Secondary | ICD-10-CM | POA: Diagnosis not present

## 2022-03-02 DIAGNOSIS — D649 Anemia, unspecified: Secondary | ICD-10-CM | POA: Diagnosis not present

## 2022-03-02 DIAGNOSIS — E782 Mixed hyperlipidemia: Secondary | ICD-10-CM | POA: Diagnosis not present

## 2022-03-02 DIAGNOSIS — E559 Vitamin D deficiency, unspecified: Secondary | ICD-10-CM | POA: Diagnosis not present

## 2022-05-06 ENCOUNTER — Other Ambulatory Visit (HOSPITAL_COMMUNITY)
Admission: RE | Admit: 2022-05-06 | Discharge: 2022-05-06 | Disposition: A | Discharge status: Home / Self Care | Payer: BC Managed Care – PPO | Source: Ambulatory Visit | Attending: Obstetrics and Gynecology | Admitting: Obstetrics and Gynecology

## 2022-05-06 ENCOUNTER — Other Ambulatory Visit: Payer: Self-pay | Admitting: Obstetrics and Gynecology

## 2022-05-06 DIAGNOSIS — Z01419 Encounter for gynecological examination (general) (routine) without abnormal findings: Secondary | ICD-10-CM | POA: Insufficient documentation

## 2022-05-06 DIAGNOSIS — N914 Secondary oligomenorrhea: Secondary | ICD-10-CM | POA: Diagnosis not present

## 2022-05-09 LAB — CYTOLOGY - PAP: Diagnosis: NEGATIVE

## 2022-05-18 DIAGNOSIS — N914 Secondary oligomenorrhea: Secondary | ICD-10-CM | POA: Diagnosis not present

## 2022-06-02 DIAGNOSIS — E0590 Thyrotoxicosis, unspecified without thyrotoxic crisis or storm: Secondary | ICD-10-CM | POA: Diagnosis not present

## 2022-07-21 DIAGNOSIS — F330 Major depressive disorder, recurrent, mild: Secondary | ICD-10-CM | POA: Diagnosis not present

## 2022-08-02 DIAGNOSIS — F330 Major depressive disorder, recurrent, mild: Secondary | ICD-10-CM | POA: Diagnosis not present

## 2022-09-06 DIAGNOSIS — F330 Major depressive disorder, recurrent, mild: Secondary | ICD-10-CM | POA: Diagnosis not present

## 2022-10-04 DIAGNOSIS — F330 Major depressive disorder, recurrent, mild: Secondary | ICD-10-CM | POA: Diagnosis not present

## 2022-11-01 DIAGNOSIS — F330 Major depressive disorder, recurrent, mild: Secondary | ICD-10-CM | POA: Diagnosis not present

## 2022-11-29 DIAGNOSIS — F330 Major depressive disorder, recurrent, mild: Secondary | ICD-10-CM | POA: Diagnosis not present

## 2023-01-26 NOTE — Progress Notes (Signed)
Name: Christy Woodward  MRN/ DOB: [id number], Aug 16, 1991    Age/ Sex: 32 y.o., female    PCP: Kristen Loader, FNP   Reason for Endocrinology Evaluation: Hyperthyroid     Date of Initial Endocrinology Evaluation: 01/27/2023     HPI: Christy Woodward is a 32 y.o. female with a past medical history of hyperthyroidism . The patient presented for initial endocrinology clinic visit on 01/27/2023 for consultative assistance with her hyperthyroidism.   Patient has been diagnosed with hyperthyroid in June 2023 with a suppressed TSH of 0.01 u IU/mL and elevated free T4 at 1.57 NG/DL, at the was noted with menorrhagia   She has not been on thioamide therapy in the past   Denies weight loss  Has heat intolerance Denies local neck swelling  LMP 1/26th - monthly they are 30-36 days  She is planning on conceiving in the next year or 2 No prior conception  Denies eye symptoms  Denies tremors  Denies palpitations  She is sexually active, uses condoms occasions  Pt requesting cortisol check up   No biotin   No Fh of  thyroid disease  HISTORY:  Past Medical History: No past medical history on file. Past Surgical History:  Past Surgical History:  Procedure Laterality Date   OPEN REDUCTION INTERNAL FIXATION (ORIF) METACARPAL Right 01/14/2019   Procedure: RIGHT HAND THIRD METACARPAL CLOSED REDUCTION AND PERCUTANEOUS PINNING, POSSIBLE OPEN REDUCTION INTERNAL FIXATION (ORIF) METACARPAL;  Surgeon: Iran Planas, MD;  Location: [facility];  Service: Orthopedics;  Laterality: Right;    Social History:  reports that she has never smoked. She has never used smokeless tobacco. She reports that she does not drink alcohol and does not use drugs. Family History: family history includes Hyperlipidemia in her maternal grandmother and mother; Hypertension in her maternal grandmother and mother.   HOME MEDICATIONS: Allergies as of 01/27/2023   No Known Allergies      Medication List        Accurate as of  January 27, 2023  5:37 PM. If you have any questions, ask your nurse or doctor.          STOP taking these medications    Melatonin 5 MG Chew Stopped by: Dorita Sciara, MD   promethazine 25 MG tablet Commonly known as: PHENERGAN Stopped by: Dorita Sciara, MD   Vitamin D (Ergocalciferol) 1.25 MG (50000 UNIT) Caps capsule Commonly known as: DRISDOL Stopped by: Dorita Sciara, MD       TAKE these medications    B-6 50 MG Tabs Take 1 tablet by mouth daily at 6 (six) AM.   FISH OIL PO Take 1 tablet by mouth daily at 6 (six) AM.   ibuprofen 200 MG tablet Commonly known as: ADVIL Take 400 mg by mouth every 8 (eight) hours as needed.   Magnesium Gluconate 550 MG Tabs Take 2 tablets by mouth daily at 6 (six) AM.   multivitamin with minerals Tabs tablet Take 1 tablet by mouth every evening.   PREBIOTIC PRODUCT PO Take 1 tablet by mouth in the morning and at bedtime.   PROBIOTIC-10 PO Take 1 tablet by mouth daily at 6 (six) AM.          REVIEW OF SYSTEMS: A comprehensive ROS was conducted with the patient and is negative except as per HPI    OBJECTIVE:  VS: BP 118/70 (BP Location: Left Arm, Patient Position: Sitting, Cuff Size: Large)   Pulse 89  Ht 5' 6"$  (1.676 m)   Wt 199 lb (90.3 kg)   SpO2 99%   BMI 32.12 kg/m    Wt Readings from Last 3 Encounters:  01/27/23 199 lb (90.3 kg)  03/04/20 203 lb 12.8 oz (92.4 kg)  01/14/19 203 lb (92.1 kg)     EXAM: General: Pt appears well and is in NAD  Eyes: External eye exam normal with a  stare, no lid lag lid lag or exophthalmos.  EOM intact.    Neck: General: Supple without adenopathy. Thyroid: Prominent thyroid gland.  No goiter or nodules appreciated. No thyroid bruit.  Lungs: Clear with good BS bilat with no rales, rhonchi, or wheezes  Heart: Auscultation: RRR.  Abdomen: Normoactive bowel sounds, soft, nontender, without masses or organomegaly palpable  Extremities:  BL LE: No  pretibial edema normal ROM and strength.  Mental Status: Judgment, insight: Intact Orientation: Oriented to time, place, and person Mood and affect: No depression, anxiety, or agitation     DATA REVIEWED:  Latest Reference Range & Units 01/27/23 11:16  Sodium 135 - 145 mEq/L 136  Potassium 3.5 - 5.1 mEq/L 4.0  Chloride 96 - 112 mEq/L 104  CO2 19 - 32 mEq/L 24  Glucose 70 - 99 mg/dL 92  BUN 6 - 23 mg/dL 14  Creatinine 0.40 - 1.20 mg/dL 0.77  Calcium 8.4 - 10.5 mg/dL 10.0  Alkaline Phosphatase 39 - 117 U/L 64  Albumin 3.5 - 5.2 g/dL 4.7  AST 0 - 37 U/L 17  ALT 0 - 35 U/L 13  Total Protein 6.0 - 8.3 g/dL 8.1  Total Bilirubin 0.2 - 1.2 mg/dL 0.4  GFR >60.00 mL/min 103.76  VITD 30.00 - 100.00 ng/mL 22.29 (L)    Latest Reference Range & Units 01/27/23 11:16  WBC 4.0 - 10.5 K/uL 8.3  RBC 3.87 - 5.11 Mil/uL 4.40  Hemoglobin 12.0 - 15.0 g/dL 12.4  HCT 36.0 - 46.0 % 37.7  MCV 78.0 - 100.0 fl 85.7  MCHC 30.0 - 36.0 g/dL 32.8  RDW 11.5 - 15.5 % 15.6 (H)  Platelets 150.0 - 400.0 K/uL 327.0  Neutrophils 43.0 - 77.0 % 58.5  Lymphocytes 12.0 - 46.0 % 34.6  Monocytes Relative 3.0 - 12.0 % 5.6  Eosinophil 0.0 - 5.0 % 0.9  Basophil 0.0 - 3.0 % 0.4  NEUT# 1.4 - 7.7 K/uL 4.9  Lymphocyte # 0.7 - 4.0 K/uL 2.9  Monocyte # 0.1 - 1.0 K/uL 0.5  Eosinophils Absolute 0.0 - 0.7 K/uL 0.1  Basophils Absolute 0.0 - 0.1 K/uL 0.0      Latest Reference Range & Units 01/27/23 11:16  Cortisol, Plasma ug/dL 11.0  Glucose 70 - 99 mg/dL 92  TSH 0.35 - 5.50 uIU/mL 2.61  T4,Free(Direct) 0.60 - 1.60 ng/dL 0.79    ASSESSMENT/PLAN/RECOMMENDATIONS:   Hyperthyroidism:  -Patient is clinically euthyroid -No local neck symptoms -We discussed most likely differential diagnoses Graves' disease, but we also discussed subacute thyroiditis versus toxic thyroid nodule -We discussed the pathophysiology of hyperthyroidism including symptoms and effects on the body We discussed that Graves' Disease is a result of  an autoimmune condition involving the thyroid.    We discussed with pt the benefits of methimazole in the Tx of hyperthyroidism, as well as the possible side effects/complications of anti-thyroid drug Tx (specifically detailing the rare, but serious side effect of agranulocytosis). She was informed of need for regular thyroid function monitoring while on methimazole to ensure appropriate dosage without over-treatment. As well, we discussed the possible  side effects of methimazole including the chance of rash, the small chance of liver irritation/juandice and the <=1 in 300-400 chance of sudden onset agranulocytosis.  We discussed importance of going to ED promptly (and stopping methimazole) if shewere to develop significant fever with severe sore throat of other evidence of acute infection.     -Repeat TFTs today are normal, there is no indication for thionamides therapy at this time -TRAb pending   2. Fatigue :  -Pt requesting cortisol  -Cortisol level is normal -Patient was wondering if check an FSH, estradiol is appropriate, but I did explain to the patient that as long as she is having monthly menstruations this indicates an normal hypothalamic-pituitary-ovarian axis -She had seen GYN in the past with normal lab work last year -She also mentioned adrenal fatigue, I did explain to the patient that the endocrine Society does not believe in the presence of adrenal fatigue this is more of a holistic diagnosis  3.  Vitamin D insufficiency:  -Patient to take vitamin D3 2000 IU daily   Signed electronically by: Mack Guise, MD  [facility]  [facility] [address] [place], [place] [address] Phone: [phone] FAX: [phone]   CC: Kristen Loader, [address] [place] [address] Phone: [phone] Fax: [phone]   Return to Endocrinology clinic as below: Future Appointments  Date Time Provider [place]   05/08/2023  9:00 AM [facility] None  05/11/2023 11:50 AM Yaretzi Ernandez, Melanie Crazier, MD [facility] None

## 2023-01-27 ENCOUNTER — Encounter: Payer: Self-pay | Admitting: Internal Medicine

## 2023-01-27 ENCOUNTER — Ambulatory Visit: Payer: Managed Care, Other (non HMO) | Admitting: Internal Medicine

## 2023-01-27 VITALS — BP 118/70 | HR 89 | Ht 66.0 in | Wt 199.0 lb

## 2023-01-27 DIAGNOSIS — E069 Thyroiditis, unspecified: Secondary | ICD-10-CM | POA: Insufficient documentation

## 2023-01-27 DIAGNOSIS — R5383 Other fatigue: Secondary | ICD-10-CM | POA: Insufficient documentation

## 2023-01-27 DIAGNOSIS — E559 Vitamin D deficiency, unspecified: Secondary | ICD-10-CM

## 2023-01-27 LAB — COMPREHENSIVE METABOLIC PANEL
ALT: 13 U/L (ref 0–35)
AST: 17 U/L (ref 0–37)
Albumin: 4.7 g/dL (ref 3.5–5.2)
Alkaline Phosphatase: 64 U/L (ref 39–117)
BUN: 14 mg/dL (ref 6–23)
CO2: 24 mEq/L (ref 19–32)
Calcium: 10 mg/dL (ref 8.4–10.5)
Chloride: 104 mEq/L (ref 96–112)
Creatinine, Ser: 0.77 mg/dL (ref 0.40–1.20)
GFR: 103.76 mL/min (ref >60.00)
Glucose, Bld: 92 mg/dL (ref 70–99)
Potassium: 4 mEq/L (ref 3.5–5.1)
Sodium: 136 mEq/L (ref 135–145)
Total Bilirubin: 0.4 mg/dL (ref 0.2–1.2)
Total Protein: 8.1 g/dL (ref 6.0–8.3)

## 2023-01-27 LAB — CBC WITH DIFFERENTIAL/PLATELET
Basophils Absolute: 0 10*3/uL (ref 0.0–0.1)
Basophils Relative: 0.4 % (ref 0.0–3.0)
Eosinophils Absolute: 0.1 10*3/uL (ref 0.0–0.7)
Eosinophils Relative: 0.9 % (ref 0.0–5.0)
HCT: 37.7 % (ref 36.0–46.0)
Hemoglobin: 12.4 g/dL (ref 12.0–15.0)
Lymphocytes Relative: 34.6 % (ref 12.0–46.0)
Lymphs Abs: 2.9 10*3/uL (ref 0.7–4.0)
MCHC: 32.8 g/dL (ref 30.0–36.0)
MCV: 85.7 fl (ref 78.0–100.0)
Monocytes Absolute: 0.5 10*3/uL (ref 0.1–1.0)
Monocytes Relative: 5.6 % (ref 3.0–12.0)
Neutro Abs: 4.9 10*3/uL (ref 1.4–7.7)
Neutrophils Relative %: 58.5 % (ref 43.0–77.0)
Platelets: 327 10*3/uL (ref 150.0–400.0)
RBC: 4.4 Mil/uL (ref 3.87–5.11)
RDW: 15.6 % — ABNORMAL HIGH (ref 11.5–15.5)
WBC: 8.3 10*3/uL (ref 4.0–10.5)

## 2023-01-27 LAB — TSH: TSH: 2.61 u[IU]/mL (ref 0.35–5.50)

## 2023-01-27 LAB — T4, FREE: Free T4: 0.79 ng/dL (ref 0.60–1.60)

## 2023-01-27 LAB — VITAMIN D 25 HYDROXY (VIT D DEFICIENCY, FRACTURES): VITD: 22.29 ng/mL — ABNORMAL LOW (ref 30.00–100.00)

## 2023-01-27 LAB — CORTISOL: Cortisol, Plasma: 11 ug/dL

## 2023-01-30 LAB — TRAB (TSH RECEPTOR BINDING ANTIBODY): TRAB: <1 IU/L (ref <=2.00)

## 2023-01-30 LAB — T3: T3, Total: 119 ng/dL (ref 76–181)

## 2023-03-01 ENCOUNTER — Ambulatory Visit: Payer: BC Managed Care – PPO | Admitting: Internal Medicine

## 2023-05-02 ENCOUNTER — Other Ambulatory Visit (INDEPENDENT_AMBULATORY_CARE_PROVIDER_SITE_OTHER): Payer: Commercial Managed Care - HMO

## 2023-05-02 DIAGNOSIS — E069 Thyroiditis, unspecified: Secondary | ICD-10-CM

## 2023-05-02 LAB — TSH: TSH: 1.43 u[IU]/mL (ref 0.35–5.50)

## 2023-05-02 LAB — T4, FREE: Free T4: 1.01 ng/dL (ref 0.60–1.60)

## 2023-05-08 ENCOUNTER — Other Ambulatory Visit: Payer: Commercial Managed Care - HMO

## 2023-05-11 ENCOUNTER — Ambulatory Visit: Payer: Commercial Managed Care - HMO | Admitting: Internal Medicine

## 2023-07-20 ENCOUNTER — Encounter: Payer: Self-pay | Admitting: Internal Medicine

## 2023-07-20 ENCOUNTER — Ambulatory Visit (INDEPENDENT_AMBULATORY_CARE_PROVIDER_SITE_OTHER): Payer: Commercial Managed Care - HMO | Admitting: Internal Medicine

## 2023-07-20 VITALS — BP 120/80 | HR 74 | Ht 66.0 in | Wt 179.0 lb

## 2023-07-20 DIAGNOSIS — Z8639 Personal history of other endocrine, nutritional and metabolic disease: Secondary | ICD-10-CM | POA: Diagnosis not present

## 2023-07-20 LAB — T3, FREE: T3, Free: 2.7 pg/mL (ref 2.3–4.2)

## 2023-07-20 LAB — TSH: TSH: 1.17 u[IU]/mL (ref 0.35–5.50)

## 2023-07-20 LAB — T4, FREE: Free T4: 0.86 ng/dL (ref 0.60–1.60)

## 2023-07-20 NOTE — Progress Notes (Signed)
Name: Christy Woodward  MRN/ DOB: [id number], [birth year]    Age/ Sex: 32 y.o., female    PCP: Soundra Pilon, FNP   Reason for Endocrinology Evaluation: Hyperthyroid     Date of Initial Endocrinology Evaluation: 01/27/2023    HPI: Christy Woodward is a 32 y.o. female with a past medical history of hyperthyroidism . The patient presented for initial endocrinology clinic visit on 01/27/2023  for consultative assistance with her hyperthyroidism.   Patient has been diagnosed with hyperthyroid in June 2023 with a suppressed TSH of 0.01 u IU/mL and elevated free T4 at 1.57 NG/DL, at the was noted with menorrhagia   She has not been on thioamide therapy in the past  No XRT  No Fh of  thyroid disease   On her initial visit to our clinic she had normal TFTs, there was no indication for thionamides therapy TRAb was undetectable She also requested cortisol checkup due to fatigue which was normal at 11 UG/dL   SUBJECTIVE:    Today (07/20/23):Christy Woodward is here for follow-up on hyperthyroidism.  Pt continues with weight loss - intentional  Has heat intolerance at night  Denies local neck swelling  LMP 3 weeks ago  - monthly  No prior conception  Denies tremors  Denies palpitations   No biotin - as it causes her rash   HISTORY:  Past Medical History: No past medical history on file. Past Surgical History:  Past Surgical History:  Procedure Laterality Date   OPEN REDUCTION INTERNAL FIXATION (ORIF) METACARPAL Right 01/14/2019   Procedure: RIGHT HAND THIRD METACARPAL CLOSED REDUCTION AND PERCUTANEOUS PINNING, POSSIBLE OPEN REDUCTION INTERNAL FIXATION (ORIF) METACARPAL;  Surgeon: Bradly Bienenstock, MD;  Location: [facility];  Service: Orthopedics;  Laterality: Right;    Social History:  reports that she has never smoked. She has never used smokeless tobacco. She reports that she does not drink alcohol and does not use drugs. Family History: family history includes Hyperlipidemia in her maternal  grandmother and mother; Hypertension in her maternal grandmother and mother.   HOME MEDICATIONS: Allergies as of 07/20/2023   No Known Allergies      Medication List        Accurate as of July 20, 2023 10:28 AM. If you have any questions, ask your nurse or doctor.          STOP taking these medications    B-6 50 MG Tabs Stopped by: Johnney Ou Jesseca Marsch   ibuprofen 200 MG tablet Commonly known as: ADVIL Stopped by: Johnney Ou Takeshi Teasdale   Magnesium Gluconate 550 MG Tabs Stopped by: Johnney Ou Zenaya Ulatowski   PREBIOTIC PRODUCT PO Stopped by: Johnney Ou Jaquese Irving   PROBIOTIC-10 PO Stopped by: Johnney Ou Lachele Lievanos       TAKE these medications    FISH OIL PO Take 1 tablet by mouth daily at 6 (six) AM.   multivitamin with minerals Tabs tablet Take 1 tablet by mouth every evening.          REVIEW OF SYSTEMS: A comprehensive ROS was conducted with the patient and is negative except as per HPI    OBJECTIVE:  VS: BP 120/80 (BP Location: Left Arm, Patient Position: Sitting, Cuff Size: Small)   Pulse 74   Ht 5\' 6"  (1.676 m)   Wt 179 lb (81.2 kg)   SpO2 98%   BMI 28.89 kg/m    Wt Readings from Last 3 Encounters:  07/20/23 179 lb (81.2 kg)  01/27/23 199 lb (  90.3 kg)  03/04/20 203 lb 12.8 oz (92.4 kg)     EXAM: General: Pt appears well and is in NAD  Neck: General: Supple without adenopathy. Thyroid:   No goiter or nodules appreciated. No thyroid bruit.  Lungs: Clear with good BS bilat   Heart: Auscultation: RRR.  Abdomen: soft, nontender  Extremities:  BL LE: No pretibial edema normal  Mental Status: Judgment, insight: Intact Orientation: Oriented to time, place, and person Mood and affect: No depression, anxiety, or agitation     DATA REVIEWED:  Latest Reference Range & Units 01/27/23 11:16  Sodium 135 - 145 mEq/L 136  Potassium 3.5 - 5.1 mEq/L 4.0  Chloride 96 - 112 mEq/L 104  CO2 19 - 32 mEq/L 24  Glucose 70 - 99 mg/dL 92  BUN 6 - 23 mg/dL  14  Creatinine 7.82 - 1.20 mg/dL 9.56  Calcium 8.4 - 21.3 mg/dL 08.6  Alkaline Phosphatase 39 - 117 U/L 64  Albumin 3.5 - 5.2 g/dL 4.7  AST 0 - 37 U/L 17  ALT 0 - 35 U/L 13  Total Protein 6.0 - 8.3 g/dL 8.1  Total Bilirubin 0.2 - 1.2 mg/dL 0.4  GFR >57.84 mL/min 103.76  VITD 30.00 - 100.00 ng/mL 22.29 (L)    Latest Reference Range & Units 01/27/23 11:16  WBC 4.0 - 10.5 K/uL 8.3  RBC 3.87 - 5.11 Mil/uL 4.40  Hemoglobin 12.0 - 15.0 g/dL 69.6  HCT 29.5 - 28.4 % 37.7  MCV 78.0 - 100.0 fl 85.7  MCHC 30.0 - 36.0 g/dL 13.2  RDW 44.0 - 10.2 % 15.6 (H)  Platelets 150.0 - 400.0 K/uL 327.0  Neutrophils 43.0 - 77.0 % 58.5  Lymphocytes 12.0 - 46.0 % 34.6  Monocytes Relative 3.0 - 12.0 % 5.6  Eosinophil 0.0 - 5.0 % 0.9  Basophil 0.0 - 3.0 % 0.4  NEUT# 1.4 - 7.7 K/uL 4.9  Lymphocyte # 0.7 - 4.0 K/uL 2.9  Monocyte # 0.1 - 1.0 K/uL 0.5  Eosinophils Absolute 0.0 - 0.7 K/uL 0.1  Basophils Absolute 0.0 - 0.1 K/uL 0.0      Latest Reference Range & Units 01/27/23 11:16  Cortisol, Plasma ug/dL 72.5  Glucose 70 - 99 mg/dL 92  TSH 3.66 - 4.40 uIU/mL 2.61  T4,Free(Direct) 0.60 - 1.60 ng/dL 3.47    ASSESSMENT/PLAN/RECOMMENDATIONS:   Hyperthyroidism:  -This has resolved, repeat TFTs remain normal on today's labs - Patient is clinically euthyroid -No local neck symptoms -TRAb undetectable   F/U PRN  Signed electronically by: Lyndle Herrlich, MD  [facility]  [facility] [address] [place] [place], [place] [address] Phone: [phone] FAX: [phone]   CC: Soundra Pilon, FNP [address] [place] [address] Phone: [phone] Fax: [phone]   Return to Endocrinology clinic as below: No future appointments.

## 2023-09-27 ENCOUNTER — Ambulatory Visit: Payer: Commercial Managed Care - HMO | Admitting: Internal Medicine

## 2024-01-26 ENCOUNTER — Other Ambulatory Visit: Payer: Self-pay | Admitting: Medical Genetics

## 2024-01-26 DIAGNOSIS — Z006 Encounter for examination for normal comparison and control in clinical research program: Secondary | ICD-10-CM

## 2024-02-09 LAB — GENECONNECT MOLECULAR SCREEN: Genetic Analysis Overall Interpretation: NEGATIVE

## 2024-03-22 DIAGNOSIS — Z0000 Encounter for general adult medical examination without abnormal findings: Secondary | ICD-10-CM | POA: Diagnosis not present

## 2024-03-22 DIAGNOSIS — E0590 Thyrotoxicosis, unspecified without thyrotoxic crisis or storm: Secondary | ICD-10-CM | POA: Diagnosis not present

## 2024-03-22 DIAGNOSIS — E782 Mixed hyperlipidemia: Secondary | ICD-10-CM | POA: Diagnosis not present

## 2024-03-22 DIAGNOSIS — E559 Vitamin D deficiency, unspecified: Secondary | ICD-10-CM | POA: Diagnosis not present

## 2024-03-22 DIAGNOSIS — D649 Anemia, unspecified: Secondary | ICD-10-CM | POA: Diagnosis not present
